# Patient Record
Sex: Female | Born: 1935 | Race: White | Hispanic: No | Smoking: Never smoker
Health system: Southern US, Community
[De-identification: ages and names within clinical notes are randomized; demographics above are authoritative.]

## PROBLEM LIST (undated history)

## (undated) DIAGNOSIS — I341 Nonrheumatic mitral (valve) prolapse: Secondary | ICD-10-CM

## (undated) DIAGNOSIS — R0989 Other specified symptoms and signs involving the circulatory and respiratory systems: Secondary | ICD-10-CM

## (undated) DIAGNOSIS — I10 Essential (primary) hypertension: Secondary | ICD-10-CM

## (undated) DIAGNOSIS — E785 Hyperlipidemia, unspecified: Secondary | ICD-10-CM

## (undated) DIAGNOSIS — R002 Palpitations: Secondary | ICD-10-CM

## (undated) HISTORY — DX: Palpitations: R00.2

## (undated) HISTORY — DX: Other specified symptoms and signs involving the circulatory and respiratory systems: R09.89

## (undated) HISTORY — DX: Nonrheumatic mitral (valve) prolapse: I34.1

## (undated) HISTORY — DX: Hyperlipidemia, unspecified: E78.5

## (undated) HISTORY — DX: Essential (primary) hypertension: I10

---

## 2000-05-03 ENCOUNTER — Encounter: Admission: RE | Admit: 2000-05-03 | Discharge: 2000-05-03 | Payer: Self-pay | Admitting: Internal Medicine

## 2000-05-03 ENCOUNTER — Encounter: Payer: Self-pay | Admitting: Internal Medicine

## 2001-04-01 ENCOUNTER — Other Ambulatory Visit: Admission: RE | Admit: 2001-04-01 | Discharge: 2001-04-01 | Payer: Self-pay | Admitting: Internal Medicine

## 2001-05-01 ENCOUNTER — Encounter: Admission: RE | Admit: 2001-05-01 | Discharge: 2001-05-01 | Payer: Self-pay | Admitting: Internal Medicine

## 2001-05-01 ENCOUNTER — Encounter: Payer: Self-pay | Admitting: Internal Medicine

## 2001-10-21 ENCOUNTER — Ambulatory Visit (HOSPITAL_COMMUNITY): Admission: RE | Admit: 2001-10-21 | Discharge: 2001-10-21 | Payer: Self-pay | Admitting: Internal Medicine

## 2002-05-04 ENCOUNTER — Encounter: Payer: Self-pay | Admitting: Internal Medicine

## 2002-05-04 ENCOUNTER — Encounter: Admission: RE | Admit: 2002-05-04 | Discharge: 2002-05-04 | Payer: Self-pay | Admitting: Internal Medicine

## 2002-06-20 ENCOUNTER — Emergency Department (HOSPITAL_COMMUNITY): Admission: EM | Admit: 2002-06-20 | Discharge: 2002-06-20 | Payer: Self-pay | Admitting: *Deleted

## 2003-06-29 ENCOUNTER — Encounter: Admission: RE | Admit: 2003-06-29 | Discharge: 2003-06-29 | Payer: Self-pay | Admitting: Internal Medicine

## 2004-05-25 ENCOUNTER — Other Ambulatory Visit: Admission: RE | Admit: 2004-05-25 | Discharge: 2004-05-25 | Payer: Self-pay | Admitting: Family Medicine

## 2004-07-17 ENCOUNTER — Encounter: Admission: RE | Admit: 2004-07-17 | Discharge: 2004-07-17 | Payer: Self-pay | Admitting: Internal Medicine

## 2006-08-08 ENCOUNTER — Encounter: Admission: RE | Admit: 2006-08-08 | Discharge: 2006-08-08 | Payer: Self-pay | Admitting: Family Medicine

## 2007-06-09 ENCOUNTER — Other Ambulatory Visit: Admission: RE | Admit: 2007-06-09 | Discharge: 2007-06-09 | Payer: Self-pay | Admitting: Family Medicine

## 2008-08-09 ENCOUNTER — Encounter: Admission: RE | Admit: 2008-08-09 | Discharge: 2008-08-09 | Payer: Self-pay | Admitting: Family Medicine

## 2009-10-27 ENCOUNTER — Encounter: Admission: RE | Admit: 2009-10-27 | Discharge: 2009-10-27 | Payer: Self-pay | Admitting: Family Medicine

## 2010-09-28 ENCOUNTER — Other Ambulatory Visit: Payer: Self-pay | Admitting: Family Medicine

## 2010-09-28 DIAGNOSIS — R102 Pelvic and perineal pain: Secondary | ICD-10-CM

## 2010-10-05 ENCOUNTER — Ambulatory Visit
Admission: RE | Admit: 2010-10-05 | Discharge: 2010-10-05 | Disposition: A | Discharge status: Home / Self Care | Payer: PRIVATE HEALTH INSURANCE | Source: Ambulatory Visit | Attending: Family Medicine | Admitting: Family Medicine

## 2010-10-05 ENCOUNTER — Ambulatory Visit
Admission: RE | Admit: 2010-10-05 | Discharge: 2010-10-05 | Disposition: A | Discharge status: Home / Self Care | Payer: Medicare Other | Source: Ambulatory Visit | Attending: Family Medicine | Admitting: Family Medicine

## 2010-10-05 DIAGNOSIS — R102 Pelvic and perineal pain: Secondary | ICD-10-CM

## 2011-04-05 ENCOUNTER — Other Ambulatory Visit: Payer: Self-pay

## 2011-04-05 ENCOUNTER — Other Ambulatory Visit (HOSPITAL_COMMUNITY)
Admission: RE | Admit: 2011-04-05 | Discharge: 2011-04-05 | Disposition: A | Discharge status: Home / Self Care | Payer: Medicare Other | Source: Ambulatory Visit | Attending: Internal Medicine | Admitting: Internal Medicine

## 2011-04-05 DIAGNOSIS — Z124 Encounter for screening for malignant neoplasm of cervix: Secondary | ICD-10-CM | POA: Insufficient documentation

## 2011-08-27 DIAGNOSIS — H353290 Exudative age-related macular degeneration, unspecified eye, stage unspecified: Secondary | ICD-10-CM | POA: Diagnosis not present

## 2011-09-28 DIAGNOSIS — H43819 Vitreous degeneration, unspecified eye: Secondary | ICD-10-CM | POA: Diagnosis not present

## 2011-09-28 DIAGNOSIS — H353190 Nonexudative age-related macular degeneration, unspecified eye, stage unspecified: Secondary | ICD-10-CM | POA: Diagnosis not present

## 2011-09-28 DIAGNOSIS — H353290 Exudative age-related macular degeneration, unspecified eye, stage unspecified: Secondary | ICD-10-CM | POA: Diagnosis not present

## 2011-09-28 DIAGNOSIS — H35059 Retinal neovascularization, unspecified, unspecified eye: Secondary | ICD-10-CM | POA: Diagnosis not present

## 2011-10-29 DIAGNOSIS — H353190 Nonexudative age-related macular degeneration, unspecified eye, stage unspecified: Secondary | ICD-10-CM | POA: Diagnosis not present

## 2011-10-29 DIAGNOSIS — H35059 Retinal neovascularization, unspecified, unspecified eye: Secondary | ICD-10-CM | POA: Diagnosis not present

## 2011-10-29 DIAGNOSIS — H43819 Vitreous degeneration, unspecified eye: Secondary | ICD-10-CM | POA: Diagnosis not present

## 2011-10-29 DIAGNOSIS — H353290 Exudative age-related macular degeneration, unspecified eye, stage unspecified: Secondary | ICD-10-CM | POA: Diagnosis not present

## 2011-11-08 DIAGNOSIS — E7800 Pure hypercholesterolemia, unspecified: Secondary | ICD-10-CM | POA: Diagnosis not present

## 2011-12-03 DIAGNOSIS — B0239 Other herpes zoster eye disease: Secondary | ICD-10-CM | POA: Diagnosis not present

## 2011-12-03 DIAGNOSIS — H353290 Exudative age-related macular degeneration, unspecified eye, stage unspecified: Secondary | ICD-10-CM | POA: Diagnosis not present

## 2011-12-03 DIAGNOSIS — H35059 Retinal neovascularization, unspecified, unspecified eye: Secondary | ICD-10-CM | POA: Diagnosis not present

## 2011-12-19 DIAGNOSIS — T148 Other injury of unspecified body region: Secondary | ICD-10-CM | POA: Diagnosis not present

## 2011-12-19 DIAGNOSIS — W57XXXA Bitten or stung by nonvenomous insect and other nonvenomous arthropods, initial encounter: Secondary | ICD-10-CM | POA: Diagnosis not present

## 2011-12-19 DIAGNOSIS — B029 Zoster without complications: Secondary | ICD-10-CM | POA: Diagnosis not present

## 2011-12-19 DIAGNOSIS — K219 Gastro-esophageal reflux disease without esophagitis: Secondary | ICD-10-CM | POA: Diagnosis not present

## 2011-12-24 DIAGNOSIS — H353290 Exudative age-related macular degeneration, unspecified eye, stage unspecified: Secondary | ICD-10-CM | POA: Diagnosis not present

## 2011-12-24 DIAGNOSIS — H35059 Retinal neovascularization, unspecified, unspecified eye: Secondary | ICD-10-CM | POA: Diagnosis not present

## 2011-12-24 DIAGNOSIS — H43819 Vitreous degeneration, unspecified eye: Secondary | ICD-10-CM | POA: Diagnosis not present

## 2011-12-24 DIAGNOSIS — H353190 Nonexudative age-related macular degeneration, unspecified eye, stage unspecified: Secondary | ICD-10-CM | POA: Diagnosis not present

## 2011-12-27 DIAGNOSIS — Z79899 Other long term (current) drug therapy: Secondary | ICD-10-CM | POA: Diagnosis not present

## 2011-12-27 DIAGNOSIS — E7800 Pure hypercholesterolemia, unspecified: Secondary | ICD-10-CM | POA: Diagnosis not present

## 2011-12-27 DIAGNOSIS — M810 Age-related osteoporosis without current pathological fracture: Secondary | ICD-10-CM | POA: Diagnosis not present

## 2011-12-27 DIAGNOSIS — R319 Hematuria, unspecified: Secondary | ICD-10-CM | POA: Diagnosis not present

## 2011-12-27 DIAGNOSIS — R209 Unspecified disturbances of skin sensation: Secondary | ICD-10-CM | POA: Diagnosis not present

## 2012-01-21 DIAGNOSIS — H353190 Nonexudative age-related macular degeneration, unspecified eye, stage unspecified: Secondary | ICD-10-CM | POA: Diagnosis not present

## 2012-01-21 DIAGNOSIS — H353290 Exudative age-related macular degeneration, unspecified eye, stage unspecified: Secondary | ICD-10-CM | POA: Diagnosis not present

## 2012-01-21 DIAGNOSIS — H35059 Retinal neovascularization, unspecified, unspecified eye: Secondary | ICD-10-CM | POA: Diagnosis not present

## 2012-01-21 DIAGNOSIS — B0232 Zoster iridocyclitis: Secondary | ICD-10-CM | POA: Diagnosis not present

## 2012-01-28 DIAGNOSIS — H35059 Retinal neovascularization, unspecified, unspecified eye: Secondary | ICD-10-CM | POA: Diagnosis not present

## 2012-01-28 DIAGNOSIS — H43819 Vitreous degeneration, unspecified eye: Secondary | ICD-10-CM | POA: Diagnosis not present

## 2012-01-28 DIAGNOSIS — H353190 Nonexudative age-related macular degeneration, unspecified eye, stage unspecified: Secondary | ICD-10-CM | POA: Diagnosis not present

## 2012-01-28 DIAGNOSIS — H353290 Exudative age-related macular degeneration, unspecified eye, stage unspecified: Secondary | ICD-10-CM | POA: Diagnosis not present

## 2012-02-12 DIAGNOSIS — B0232 Zoster iridocyclitis: Secondary | ICD-10-CM | POA: Diagnosis not present

## 2012-03-04 DIAGNOSIS — H353290 Exudative age-related macular degeneration, unspecified eye, stage unspecified: Secondary | ICD-10-CM | POA: Diagnosis not present

## 2012-03-17 DIAGNOSIS — H353290 Exudative age-related macular degeneration, unspecified eye, stage unspecified: Secondary | ICD-10-CM | POA: Diagnosis not present

## 2012-03-17 DIAGNOSIS — H43819 Vitreous degeneration, unspecified eye: Secondary | ICD-10-CM | POA: Diagnosis not present

## 2012-03-17 DIAGNOSIS — H353190 Nonexudative age-related macular degeneration, unspecified eye, stage unspecified: Secondary | ICD-10-CM | POA: Diagnosis not present

## 2012-03-17 DIAGNOSIS — H35059 Retinal neovascularization, unspecified, unspecified eye: Secondary | ICD-10-CM | POA: Diagnosis not present

## 2012-04-10 DIAGNOSIS — Z23 Encounter for immunization: Secondary | ICD-10-CM | POA: Diagnosis not present

## 2012-04-10 DIAGNOSIS — B0229 Other postherpetic nervous system involvement: Secondary | ICD-10-CM | POA: Diagnosis not present

## 2012-04-10 DIAGNOSIS — B029 Zoster without complications: Secondary | ICD-10-CM | POA: Diagnosis not present

## 2012-05-13 DIAGNOSIS — H35059 Retinal neovascularization, unspecified, unspecified eye: Secondary | ICD-10-CM | POA: Diagnosis not present

## 2012-05-13 DIAGNOSIS — H353290 Exudative age-related macular degeneration, unspecified eye, stage unspecified: Secondary | ICD-10-CM | POA: Diagnosis not present

## 2012-06-10 DIAGNOSIS — B029 Zoster without complications: Secondary | ICD-10-CM | POA: Diagnosis not present

## 2012-06-10 DIAGNOSIS — E785 Hyperlipidemia, unspecified: Secondary | ICD-10-CM | POA: Diagnosis not present

## 2012-06-10 DIAGNOSIS — E559 Vitamin D deficiency, unspecified: Secondary | ICD-10-CM | POA: Diagnosis not present

## 2012-06-10 DIAGNOSIS — Z79899 Other long term (current) drug therapy: Secondary | ICD-10-CM | POA: Diagnosis not present

## 2012-07-26 IMAGING — US US PELVIS COMPLETE
1 series · 14 of 25 positions shown · non-contrast
Comparison: None

CLINICAL DATA: Pelvic pain.



[Series 1: us pelvis complete · 0.24mm/px · 14 of 73 slices shown]
[im 1/73]
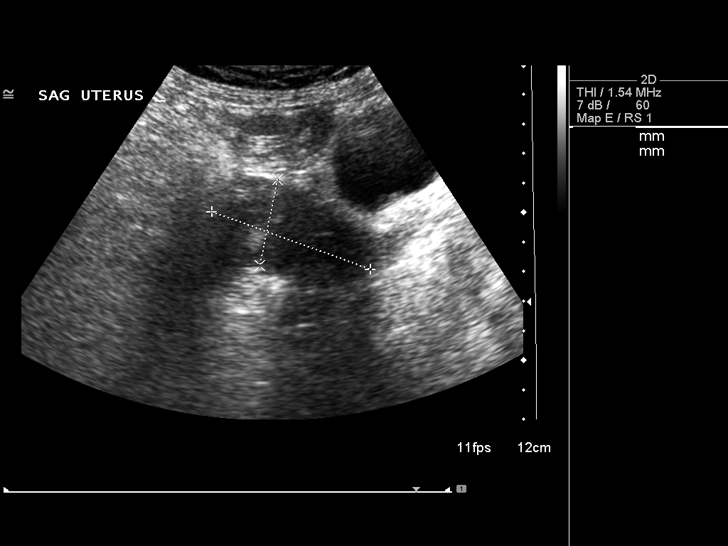
[im 7/73]
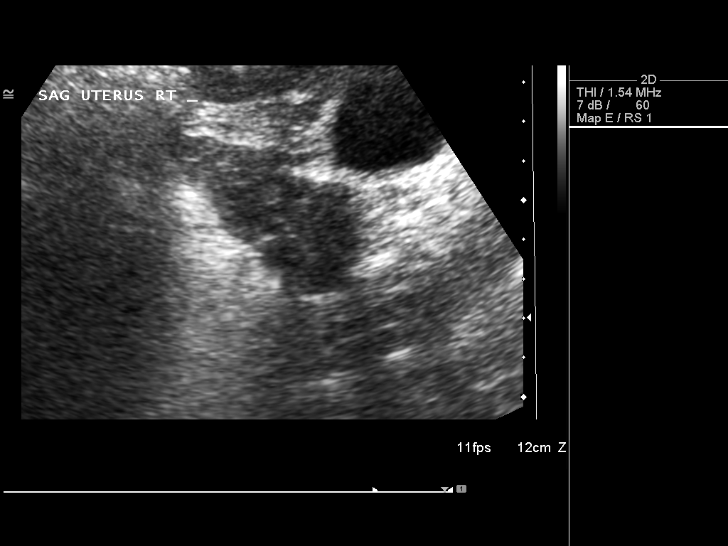
[im 13/73]
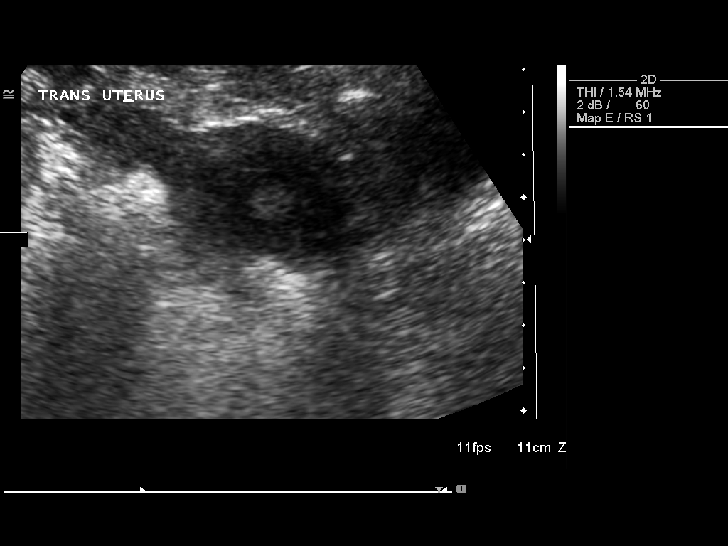
[im 19/73]
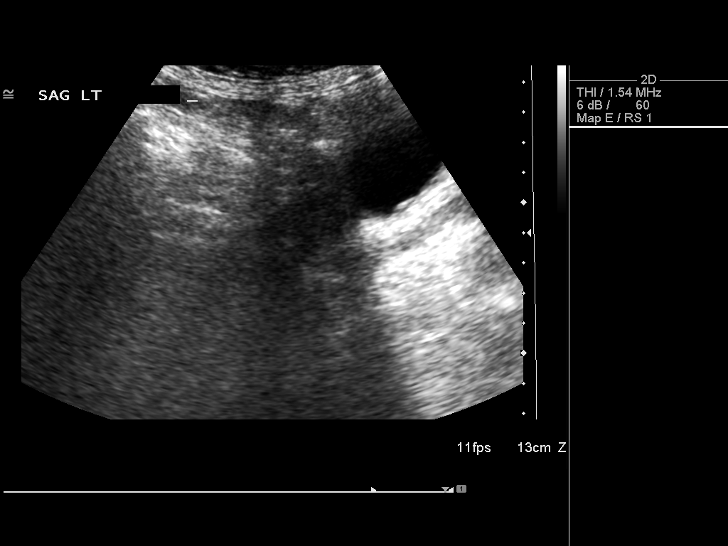
[im 25/73]
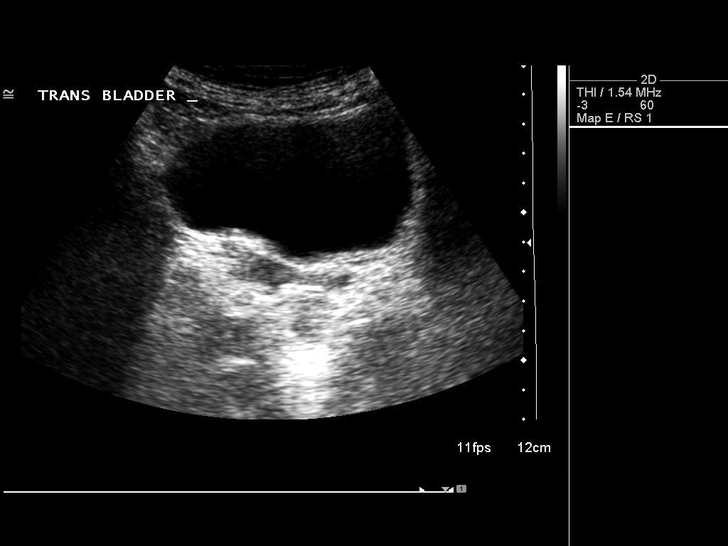
[im 28/73]
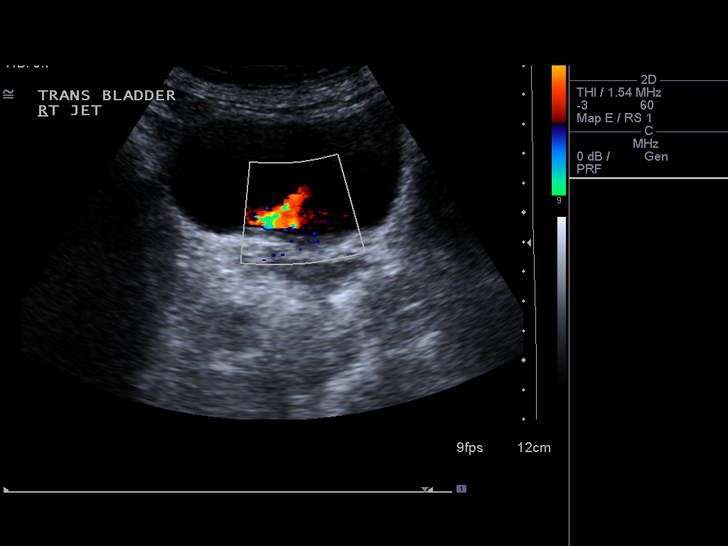
[im 34/73]
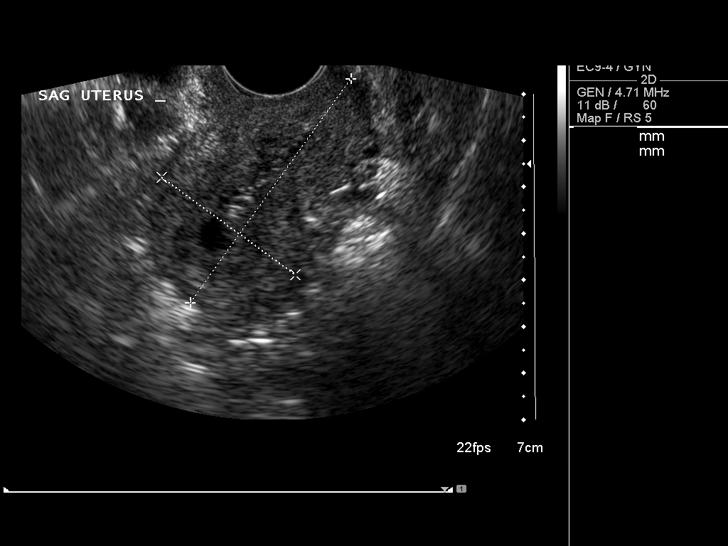
[im 40/73]
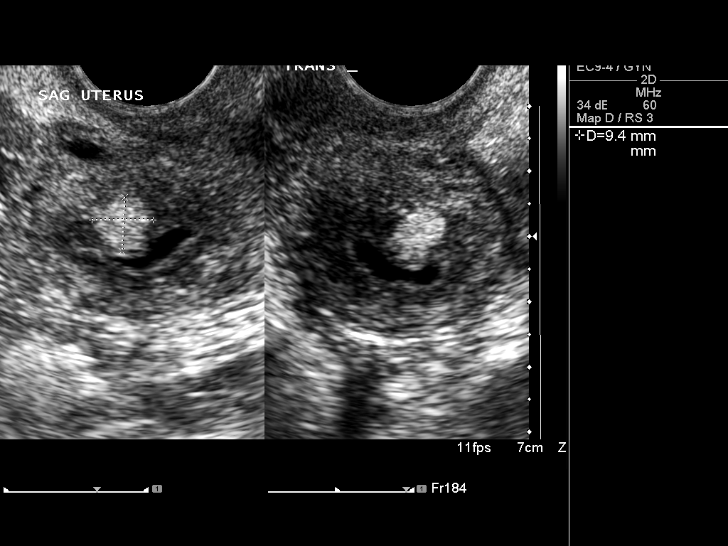
[im 46/73]
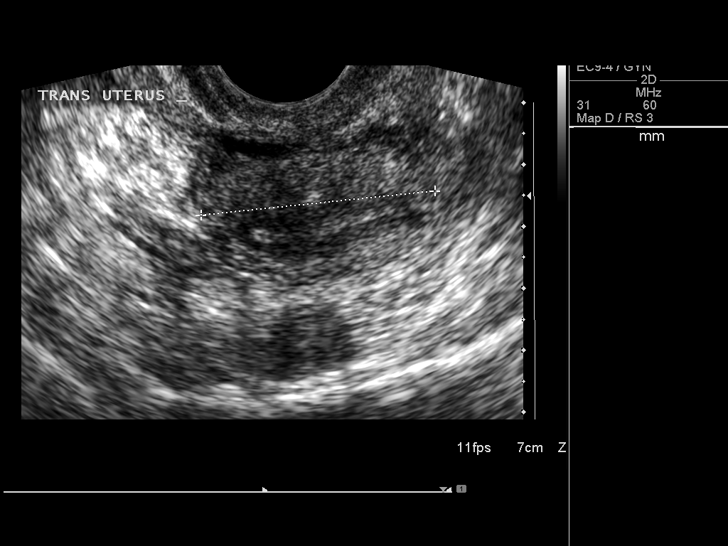
[im 49/73]
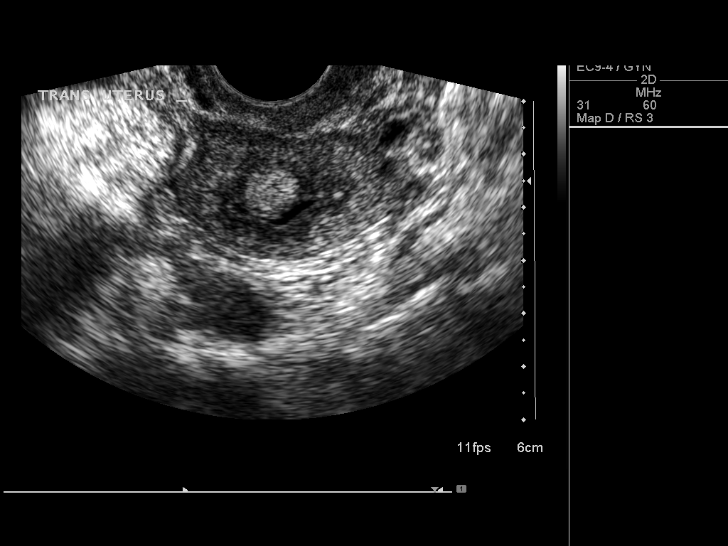
[im 55/73]
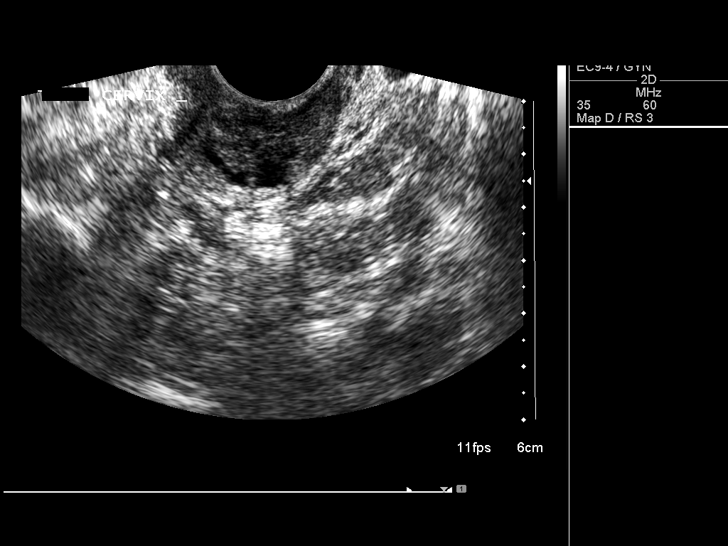
[im 61/73]
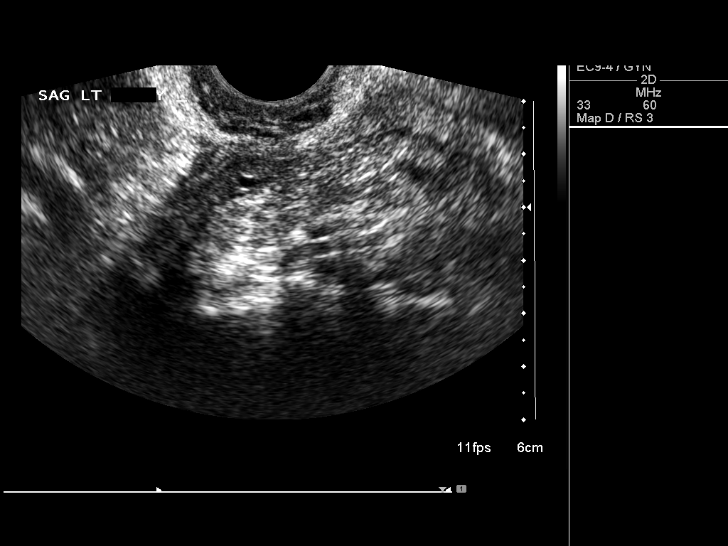
[im 67/73]
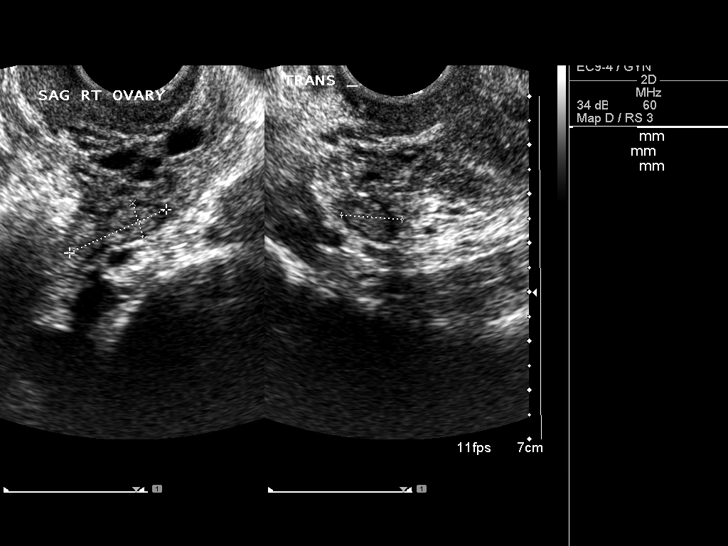
[im 73/73]
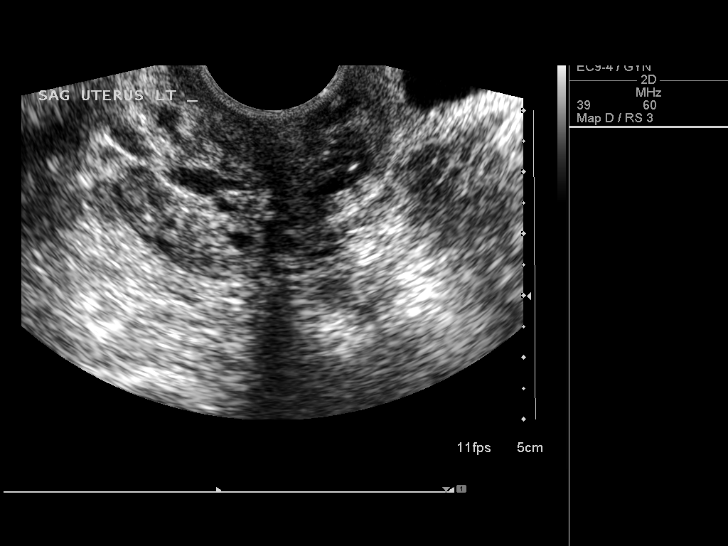

[14 of 25 positions shown; findings below may reference images not displayed]

FINDINGS: Uterus measures 5.9 x 3.6 x 3.8 cm.  The uterus is flexed to the
right side.  A rounded hyperechoic lesion is noted in the fundal
region anteriorly measuring 9 x 8 x 11 mm.  This is most likely a
submucosal fibroid.

Endometrium normal and thickness measuring 4 mm.  A small amount of
fluid in the endometrial canal is noted.

Right Ovary measures 2.2 x 0.8 x 1.2 cm.  No cysts or masses.

Left Ovary measures 2.9 x 1.4 x 2.9 cm.  No cysts or masses.

Other Findings:  No free pelvic fluid collections.
IMPRESSION: 1.  11 mm hyperechoic lesion bulging slightly into the endometrium,
most likely a submucosal fibroid.
2.  Normal ovaries.
3.  Small amount of endometrial fluid.

## 2012-08-19 DIAGNOSIS — H353290 Exudative age-related macular degeneration, unspecified eye, stage unspecified: Secondary | ICD-10-CM | POA: Diagnosis not present

## 2012-12-16 DIAGNOSIS — H353190 Nonexudative age-related macular degeneration, unspecified eye, stage unspecified: Secondary | ICD-10-CM | POA: Diagnosis not present

## 2012-12-16 DIAGNOSIS — H353290 Exudative age-related macular degeneration, unspecified eye, stage unspecified: Secondary | ICD-10-CM | POA: Diagnosis not present

## 2012-12-16 DIAGNOSIS — H35059 Retinal neovascularization, unspecified, unspecified eye: Secondary | ICD-10-CM | POA: Diagnosis not present

## 2012-12-25 DIAGNOSIS — R350 Frequency of micturition: Secondary | ICD-10-CM | POA: Diagnosis not present

## 2012-12-25 DIAGNOSIS — Z79899 Other long term (current) drug therapy: Secondary | ICD-10-CM | POA: Diagnosis not present

## 2012-12-25 DIAGNOSIS — J309 Allergic rhinitis, unspecified: Secondary | ICD-10-CM | POA: Diagnosis not present

## 2012-12-25 DIAGNOSIS — K219 Gastro-esophageal reflux disease without esophagitis: Secondary | ICD-10-CM | POA: Diagnosis not present

## 2012-12-25 DIAGNOSIS — M810 Age-related osteoporosis without current pathological fracture: Secondary | ICD-10-CM | POA: Diagnosis not present

## 2012-12-25 DIAGNOSIS — E785 Hyperlipidemia, unspecified: Secondary | ICD-10-CM | POA: Diagnosis not present

## 2013-05-12 DIAGNOSIS — H353190 Nonexudative age-related macular degeneration, unspecified eye, stage unspecified: Secondary | ICD-10-CM | POA: Diagnosis not present

## 2013-05-12 DIAGNOSIS — H35379 Puckering of macula, unspecified eye: Secondary | ICD-10-CM | POA: Diagnosis not present

## 2013-05-12 DIAGNOSIS — H353290 Exudative age-related macular degeneration, unspecified eye, stage unspecified: Secondary | ICD-10-CM | POA: Diagnosis not present

## 2013-07-08 DIAGNOSIS — M25519 Pain in unspecified shoulder: Secondary | ICD-10-CM | POA: Diagnosis not present

## 2013-07-08 DIAGNOSIS — K219 Gastro-esophageal reflux disease without esophagitis: Secondary | ICD-10-CM | POA: Diagnosis not present

## 2013-07-08 DIAGNOSIS — Z79899 Other long term (current) drug therapy: Secondary | ICD-10-CM | POA: Diagnosis not present

## 2013-07-08 DIAGNOSIS — R350 Frequency of micturition: Secondary | ICD-10-CM | POA: Diagnosis not present

## 2013-07-08 DIAGNOSIS — E785 Hyperlipidemia, unspecified: Secondary | ICD-10-CM | POA: Diagnosis not present

## 2013-07-08 DIAGNOSIS — R5383 Other fatigue: Secondary | ICD-10-CM | POA: Diagnosis not present

## 2013-07-08 DIAGNOSIS — T07XXXA Unspecified multiple injuries, initial encounter: Secondary | ICD-10-CM | POA: Diagnosis not present

## 2013-07-08 DIAGNOSIS — R5381 Other malaise: Secondary | ICD-10-CM | POA: Diagnosis not present

## 2013-10-13 DIAGNOSIS — H35379 Puckering of macula, unspecified eye: Secondary | ICD-10-CM | POA: Diagnosis not present

## 2013-10-13 DIAGNOSIS — H2510 Age-related nuclear cataract, unspecified eye: Secondary | ICD-10-CM | POA: Diagnosis not present

## 2013-10-13 DIAGNOSIS — H353290 Exudative age-related macular degeneration, unspecified eye, stage unspecified: Secondary | ICD-10-CM | POA: Diagnosis not present

## 2014-04-20 DIAGNOSIS — H353190 Nonexudative age-related macular degeneration, unspecified eye, stage unspecified: Secondary | ICD-10-CM | POA: Diagnosis not present

## 2014-04-20 DIAGNOSIS — H353290 Exudative age-related macular degeneration, unspecified eye, stage unspecified: Secondary | ICD-10-CM | POA: Diagnosis not present

## 2014-06-16 DIAGNOSIS — E780 Pure hypercholesterolemia: Secondary | ICD-10-CM | POA: Diagnosis not present

## 2014-06-16 DIAGNOSIS — M810 Age-related osteoporosis without current pathological fracture: Secondary | ICD-10-CM | POA: Diagnosis not present

## 2014-06-16 DIAGNOSIS — Z79899 Other long term (current) drug therapy: Secondary | ICD-10-CM | POA: Diagnosis not present

## 2014-06-24 ENCOUNTER — Other Ambulatory Visit: Payer: Self-pay | Admitting: Family Medicine

## 2014-06-24 DIAGNOSIS — R1030 Lower abdominal pain, unspecified: Secondary | ICD-10-CM | POA: Diagnosis not present

## 2014-06-24 DIAGNOSIS — L989 Disorder of the skin and subcutaneous tissue, unspecified: Secondary | ICD-10-CM | POA: Diagnosis not present

## 2014-06-24 DIAGNOSIS — M810 Age-related osteoporosis without current pathological fracture: Secondary | ICD-10-CM | POA: Diagnosis not present

## 2014-06-24 DIAGNOSIS — H9190 Unspecified hearing loss, unspecified ear: Secondary | ICD-10-CM | POA: Diagnosis not present

## 2014-06-24 DIAGNOSIS — K219 Gastro-esophageal reflux disease without esophagitis: Secondary | ICD-10-CM | POA: Diagnosis not present

## 2014-06-24 DIAGNOSIS — R42 Dizziness and giddiness: Secondary | ICD-10-CM | POA: Diagnosis not present

## 2014-06-24 DIAGNOSIS — E780 Pure hypercholesterolemia: Secondary | ICD-10-CM | POA: Diagnosis not present

## 2014-06-24 DIAGNOSIS — E569 Vitamin deficiency, unspecified: Secondary | ICD-10-CM | POA: Diagnosis not present

## 2014-07-23 DIAGNOSIS — W57XXXA Bitten or stung by nonvenomous insect and other nonvenomous arthropods, initial encounter: Secondary | ICD-10-CM | POA: Diagnosis not present

## 2014-07-23 DIAGNOSIS — S30861A Insect bite (nonvenomous) of abdominal wall, initial encounter: Secondary | ICD-10-CM | POA: Diagnosis not present

## 2014-08-09 DIAGNOSIS — R05 Cough: Secondary | ICD-10-CM | POA: Diagnosis not present

## 2014-08-09 DIAGNOSIS — H6123 Impacted cerumen, bilateral: Secondary | ICD-10-CM | POA: Diagnosis not present

## 2014-08-09 DIAGNOSIS — H9190 Unspecified hearing loss, unspecified ear: Secondary | ICD-10-CM | POA: Diagnosis not present

## 2014-08-09 DIAGNOSIS — J310 Chronic rhinitis: Secondary | ICD-10-CM | POA: Diagnosis not present

## 2014-08-09 DIAGNOSIS — K219 Gastro-esophageal reflux disease without esophagitis: Secondary | ICD-10-CM | POA: Diagnosis not present

## 2014-08-16 ENCOUNTER — Inpatient Hospital Stay: Admission: RE | Admit: 2014-08-16 | Payer: PRIVATE HEALTH INSURANCE | Source: Ambulatory Visit

## 2014-08-19 DIAGNOSIS — L821 Other seborrheic keratosis: Secondary | ICD-10-CM | POA: Diagnosis not present

## 2014-08-19 DIAGNOSIS — L570 Actinic keratosis: Secondary | ICD-10-CM | POA: Diagnosis not present

## 2014-09-01 DIAGNOSIS — R351 Nocturia: Secondary | ICD-10-CM | POA: Diagnosis not present

## 2014-10-19 DIAGNOSIS — H3531 Nonexudative age-related macular degeneration: Secondary | ICD-10-CM | POA: Diagnosis not present

## 2014-10-19 DIAGNOSIS — H35371 Puckering of macula, right eye: Secondary | ICD-10-CM | POA: Diagnosis not present

## 2014-12-28 ENCOUNTER — Emergency Department (INDEPENDENT_AMBULATORY_CARE_PROVIDER_SITE_OTHER)
Admission: EM | Admit: 2014-12-28 | Discharge: 2014-12-28 | Disposition: A | Discharge status: Home / Self Care | Payer: Medicare Other | Source: Home / Self Care | Attending: Family Medicine | Admitting: Family Medicine

## 2014-12-28 ENCOUNTER — Encounter (HOSPITAL_COMMUNITY): Payer: Self-pay | Admitting: Emergency Medicine

## 2014-12-28 DIAGNOSIS — H8110 Benign paroxysmal vertigo, unspecified ear: Secondary | ICD-10-CM

## 2014-12-28 DIAGNOSIS — R42 Dizziness and giddiness: Secondary | ICD-10-CM | POA: Diagnosis not present

## 2014-12-28 MED ORDER — MECLIZINE HCL 25 MG PO TABS: ORAL_TABLET | ORAL | Status: DC

## 2014-12-28 NOTE — Discharge Instructions (Signed)
Benign Positional Vertigo Meclizine 12.5 mg every 6 to 8 hours as needed for dizziness. This can cause some drowsiness, be careful. Slow movements, no bending or rapid movements. See your doctor tomorrow.  Vertigo means you feel like you or your surroundings are moving when they are not. Benign positional vertigo is the most common form of vertigo. Benign means that the cause of your condition is not serious. Benign positional vertigo is more common in older adults. CAUSES  Benign positional vertigo is the result of an upset in the labyrinth system. This is an area in the middle ear that helps control your balance. This may be caused by a viral infection, head injury, or repetitive motion. However, often no specific cause is found. SYMPTOMS  Symptoms of benign positional vertigo occur when you move your head or eyes in different directions. Some of the symptoms may include:  Loss of balance and falls.  Vomiting.  Blurred vision.  Dizziness.  Nausea.  Involuntary eye movements (nystagmus). DIAGNOSIS  Benign positional vertigo is usually diagnosed by physical exam. If the specific cause of your benign positional vertigo is unknown, your caregiver may perform imaging tests, such as magnetic resonance imaging (MRI) or computed tomography (CT). TREATMENT  Your caregiver may recommend movements or procedures to correct the benign positional vertigo. Medicines such as meclizine, benzodiazepines, and medicines for nausea may be used to treat your symptoms. In rare cases, if your symptoms are caused by certain conditions that affect the inner ear, you may need surgery. HOME CARE INSTRUCTIONS   Follow your caregiver's instructions.  Move slowly. Do not make sudden body or head movements.  Avoid driving.  Avoid operating heavy machinery.  Avoid performing any tasks that would be dangerous to you or others during a vertigo episode.  Drink enough fluids to keep your urine clear or pale  yellow. SEEK IMMEDIATE MEDICAL CARE IF:   You develop problems with walking, weakness, numbness, or using your arms, hands, or legs.  You have difficulty speaking.  You develop severe headaches.  Your nausea or vomiting continues or gets worse.  You develop visual changes.  Your family or friends notice any behavioral changes.  Your condition gets worse.  You have a fever.  You develop a stiff neck or sensitivity to light. MAKE SURE YOU:   Understand these instructions.  Will watch your condition.  Will get help right away if you are not doing well or get worse. Document Released: 04/30/2006 Document Revised: 10/15/2011 Document Reviewed: 04/12/2011 [facility] Patient Information 2015 [employer], [place]. This information is not intended to replace advice given to you by your health care provider. Make sure you discuss any questions you have with your health care provider.  Dizziness Dizziness is a common problem. It is a feeling of unsteadiness or light-headedness. You may feel like you are about to faint. Dizziness can lead to injury if you stumble or fall. A person of any age group can suffer from dizziness, but dizziness is more common in older adults. CAUSES  Dizziness can be caused by many different things, including:  Middle ear problems.  Standing for too long.  Infections.  An allergic reaction.  Aging.  An emotional response to something, such as the sight of blood.  Side effects of medicines.  Tiredness.  Problems with circulation or blood pressure.  Excessive use of alcohol or medicines, or illegal drug use.  Breathing too fast (hyperventilation).  An irregular heart rhythm (arrhythmia).  A low red blood cell count (anemia).  Pregnancy.  Vomiting, diarrhea, fever, or other illnesses that cause body fluid loss (dehydration).  Diseases or conditions such as Parkinson's disease, high blood pressure (hypertension), diabetes, and thyroid  problems.  Exposure to extreme heat. DIAGNOSIS  Your health care provider will ask about your symptoms, perform a physical exam, and perform an electrocardiogram (ECG) to record the electrical activity of your heart. Your health care provider may also perform other heart or blood tests to determine the cause of your dizziness. These may include:  Transthoracic echocardiogram (TTE). During echocardiography, sound waves are used to evaluate how blood flows through your heart.  Transesophageal echocardiogram (TEE).  Cardiac monitoring. This allows your health care provider to monitor your heart rate and rhythm in real time.  Holter monitor. This is a portable device that records your heartbeat and can help diagnose heart arrhythmias. It allows your health care provider to track your heart activity for several days if needed.  Stress tests by exercise or by giving medicine that makes the heart beat faster. TREATMENT  Treatment of dizziness depends on the cause of your symptoms and can vary greatly. HOME CARE INSTRUCTIONS   Drink enough fluids to keep your urine clear or pale yellow. This is especially important in very hot weather. In older adults, it is also important in cold weather.  Take your medicine exactly as directed if your dizziness is caused by medicines. When taking blood pressure medicines, it is especially important to get up slowly.  Rise slowly from chairs and steady yourself until you feel okay.  In the morning, first sit up on the side of the bed. When you feel okay, stand slowly while holding onto something until you know your balance is fine.  Move your legs often if you need to stand in one place for a long time. Tighten and relax your muscles in your legs while standing.  Have someone stay with you for 1-2 days if dizziness continues to be a problem. Do this until you feel you are well enough to stay alone. Have the person call your health care provider if he or she  notices changes in you that are concerning.  Do not drive or use heavy machinery if you feel dizzy.  Do not drink alcohol. SEEK IMMEDIATE MEDICAL CARE IF:   Your dizziness or light-headedness gets worse.  You feel nauseous or vomit.  You have problems talking, walking, or using your arms, hands, or legs.  You feel weak.  You are not thinking clearly or you have trouble forming sentences. It may take a friend or family member to notice this.  You have chest pain, abdominal pain, shortness of breath, or sweating.  Your vision changes.  You notice any bleeding.  You have side effects from medicine that seems to be getting worse rather than better. MAKE SURE YOU:   Understand these instructions.  Will watch your condition.  Will get help right away if you are not doing well or get worse. Document Released: 01/16/2001 Document Revised: 07/28/2013 Document Reviewed: 02/09/2011 [facility] Patient Information 2015 [place], [place]. This information is not intended to replace advice given to you by your health care provider. Make sure you discuss any questions you have with your health care provider.

## 2014-12-28 NOTE — ED Provider Notes (Signed)
CSN: [id number]     Arrival date & time 12/28/14  1152 History   First MD Initiated Contact with Patient 12/28/14 [phone]     Chief Complaint  Patient presents with  . Dizziness   (Consider location/radiation/quality/duration/timing/severity/associated sxs/prior Treatment) HPI Comments: 81 year old female presents with a complaint of dizziness and vertigo. Her first episode of vertigo was in February 2016. She states that while in bed she noticed that the room was spinning. She became nauseated and vomited. After a brief period of symptoms they finally abated. This morning upon awakening at 6:30 AM as she moved her head she developed an acute episode of vertigo. She sat still for a few minutes and then when bending down the dizziness and vertigo that worse and she began to lose her balance. She did not fall or have any injurious event. When trying to ambulate and perform ADLs this morning she continued to have episodes of balance problems and dizziness. She was brought to the urgent care by her granddaughter. Patient denies problems with vision, speech, hearing, swallowing, focal paresthesias or weakness. Denies headache. She denies heaviness, tightness, fullness, pressure or other discomfort within the chest. No shortness of breath. No extremity pain. Denies problems with concentration, confusion, disorientation or memory. Her speech is clear and has good recall.    History reviewed. No pertinent past medical history. History reviewed. No pertinent past surgical history. History reviewed. No pertinent family history. History  Substance Use Topics  . Smoking status: Never Smoker   . Smokeless tobacco: Not on file  . Alcohol Use: No   OB History    No data available     Review of Systems  Constitutional: Positive for activity change and appetite change. Negative for fever, chills and fatigue.  HENT: Positive for hearing loss. Negative for congestion, dental problem, drooling, ear discharge,  ear pain, facial swelling, postnasal drip, rhinorrhea, sinus pressure, sneezing, sore throat and tinnitus.   Eyes: Negative for pain, discharge and visual disturbance.  Respiratory: Negative for cough, chest tightness, shortness of breath and wheezing.   Cardiovascular: Negative for chest pain, palpitations and leg swelling.  Gastrointestinal: Negative.  Negative for nausea, vomiting and abdominal pain.  Genitourinary: Negative.   Musculoskeletal: Negative.   Skin: Negative.   Neurological: Positive for dizziness and light-headedness. Negative for tremors, seizures, syncope, facial asymmetry, speech difficulty, weakness, numbness and headaches.  Hematological: Negative for adenopathy.  Psychiatric/Behavioral: Negative for hallucinations, behavioral problems, confusion, sleep disturbance, self-injury, dysphoric mood, decreased concentration and agitation. The patient is not hyperactive.     Allergies  Review of patient's allergies indicates no known allergies.  Home Medications   Prior to Admission medications   Medication Sig Start Date End Date Taking? Authorizing Provider  meclizine (ANTIVERT) 25 MG tablet Take 1/2 tablet every 6 to 8 hours prn dizziness. 12/28/14   Hayden Rasmussenavid Mabe, NP   BP 181/86 mmHg  Pulse 82  Temp(Src) 97.1 F (36.2 C) (Oral)  Resp 16  SpO2 95% Physical Exam  Constitutional: She is oriented to person, place, and time. She appears well-developed and well-nourished. No distress.  HENT:  Bilateral TMs are normal. No signs of effusion, erythema or retraction. Oropharynx is clear. No erythema, exudates or swelling. Swallowing reflex is normal. No intraoral swelling. Soft palate rises symmetrically. Uvula midline. Tongue midline.  Eyes: Conjunctivae and EOM are normal. Pupils are equal, round, and reactive to light.  Neck: Normal range of motion. Neck supple.  Cardiovascular: Normal rate, regular rhythm, normal heart sounds and intact  distal pulses.   No murmur  heard. Pulmonary/Chest: Effort normal and breath sounds normal. No respiratory distress. She has no wheezes. She has no rales.  Abdominal: Soft. Bowel sounds are normal.  Musculoskeletal: Normal range of motion. She exhibits no edema or tenderness.  Neurological: She is alert and oriented to person, place, and time. She has normal strength and normal reflexes. She displays no tremor. No cranial nerve deficit or sensory deficit. She exhibits normal muscle tone. She displays a negative Romberg sign. Coordination and gait normal.  Heel to toe a little shaky but able to perform the test.  Nursing note and vitals reviewed.   ED Course  Procedures (including critical care time) Labs Review Labs Reviewed - No data to display  Imaging Review No results found.   MDM   1. BPV (benign positional vertigo), unspecified laterality   2. Dizziness    Meclizine 12.5 mg every 6 to 8 hours as needed for dizziness. This can cause some drowsiness, be careful. Slow movements, no bending or rapid movements. See your doctor tomorrow.     Hayden Rasmussen, NP 12/28/14 [phone]

## 2014-12-28 NOTE — ED Notes (Signed)
Reports having an episode of dizziness this morning shortly after waking and while on the toilet.   Denies fever, n/v/d.  No chest pain or sob.   Pt tried taking two baby aspirin with no relief.

## 2014-12-29 DIAGNOSIS — E785 Hyperlipidemia, unspecified: Secondary | ICD-10-CM | POA: Diagnosis not present

## 2014-12-29 DIAGNOSIS — R42 Dizziness and giddiness: Secondary | ICD-10-CM | POA: Diagnosis not present

## 2015-02-04 DIAGNOSIS — R319 Hematuria, unspecified: Secondary | ICD-10-CM | POA: Diagnosis not present

## 2015-02-04 DIAGNOSIS — E785 Hyperlipidemia, unspecified: Secondary | ICD-10-CM | POA: Diagnosis not present

## 2015-02-04 DIAGNOSIS — Z79899 Other long term (current) drug therapy: Secondary | ICD-10-CM | POA: Diagnosis not present

## 2015-02-04 DIAGNOSIS — E559 Vitamin D deficiency, unspecified: Secondary | ICD-10-CM | POA: Diagnosis not present

## 2015-02-04 DIAGNOSIS — M810 Age-related osteoporosis without current pathological fracture: Secondary | ICD-10-CM | POA: Diagnosis not present

## 2015-04-26 DIAGNOSIS — H43813 Vitreous degeneration, bilateral: Secondary | ICD-10-CM | POA: Diagnosis not present

## 2015-04-26 DIAGNOSIS — H3531 Nonexudative age-related macular degeneration: Secondary | ICD-10-CM | POA: Diagnosis not present

## 2015-04-26 DIAGNOSIS — H35371 Puckering of macula, right eye: Secondary | ICD-10-CM | POA: Diagnosis not present

## 2015-11-01 DIAGNOSIS — H353112 Nonexudative age-related macular degeneration, right eye, intermediate dry stage: Secondary | ICD-10-CM | POA: Diagnosis not present

## 2015-11-01 DIAGNOSIS — H43813 Vitreous degeneration, bilateral: Secondary | ICD-10-CM | POA: Diagnosis not present

## 2015-11-01 DIAGNOSIS — H353222 Exudative age-related macular degeneration, left eye, with inactive choroidal neovascularization: Secondary | ICD-10-CM | POA: Diagnosis not present

## 2015-11-01 DIAGNOSIS — H35371 Puckering of macula, right eye: Secondary | ICD-10-CM | POA: Diagnosis not present

## 2015-12-21 DIAGNOSIS — E785 Hyperlipidemia, unspecified: Secondary | ICD-10-CM | POA: Diagnosis not present

## 2015-12-21 DIAGNOSIS — Z79899 Other long term (current) drug therapy: Secondary | ICD-10-CM | POA: Diagnosis not present

## 2015-12-21 DIAGNOSIS — E559 Vitamin D deficiency, unspecified: Secondary | ICD-10-CM | POA: Diagnosis not present

## 2015-12-27 DIAGNOSIS — Z87442 Personal history of urinary calculi: Secondary | ICD-10-CM | POA: Diagnosis not present

## 2015-12-27 DIAGNOSIS — K30 Functional dyspepsia: Secondary | ICD-10-CM | POA: Diagnosis not present

## 2015-12-27 DIAGNOSIS — R319 Hematuria, unspecified: Secondary | ICD-10-CM | POA: Diagnosis not present

## 2015-12-27 DIAGNOSIS — M810 Age-related osteoporosis without current pathological fracture: Secondary | ICD-10-CM | POA: Diagnosis not present

## 2015-12-27 DIAGNOSIS — Z1239 Encounter for other screening for malignant neoplasm of breast: Secondary | ICD-10-CM | POA: Diagnosis not present

## 2015-12-27 DIAGNOSIS — E785 Hyperlipidemia, unspecified: Secondary | ICD-10-CM | POA: Diagnosis not present

## 2015-12-27 DIAGNOSIS — R8290 Unspecified abnormal findings in urine: Secondary | ICD-10-CM | POA: Diagnosis not present

## 2015-12-27 DIAGNOSIS — E559 Vitamin D deficiency, unspecified: Secondary | ICD-10-CM | POA: Diagnosis not present

## 2015-12-27 DIAGNOSIS — R350 Frequency of micturition: Secondary | ICD-10-CM | POA: Diagnosis not present

## 2015-12-30 ENCOUNTER — Other Ambulatory Visit: Payer: Self-pay | Admitting: Family Medicine

## 2015-12-30 DIAGNOSIS — M810 Age-related osteoporosis without current pathological fracture: Secondary | ICD-10-CM

## 2016-01-13 ENCOUNTER — Ambulatory Visit
Admission: RE | Admit: 2016-01-13 | Discharge: 2016-01-13 | Disposition: A | Discharge status: Home / Self Care | Payer: Medicare Other | Source: Ambulatory Visit | Attending: Family Medicine | Admitting: Family Medicine

## 2016-01-13 DIAGNOSIS — M810 Age-related osteoporosis without current pathological fracture: Secondary | ICD-10-CM | POA: Diagnosis not present

## 2016-01-13 DIAGNOSIS — Z780 Asymptomatic menopausal state: Secondary | ICD-10-CM | POA: Diagnosis not present

## 2016-02-02 DIAGNOSIS — H353132 Nonexudative age-related macular degeneration, bilateral, intermediate dry stage: Secondary | ICD-10-CM | POA: Diagnosis not present

## 2016-02-02 DIAGNOSIS — H2513 Age-related nuclear cataract, bilateral: Secondary | ICD-10-CM | POA: Diagnosis not present

## 2016-04-24 DIAGNOSIS — H353211 Exudative age-related macular degeneration, right eye, with active choroidal neovascularization: Secondary | ICD-10-CM | POA: Diagnosis not present

## 2016-04-24 DIAGNOSIS — H43813 Vitreous degeneration, bilateral: Secondary | ICD-10-CM | POA: Diagnosis not present

## 2016-04-24 DIAGNOSIS — H353222 Exudative age-related macular degeneration, left eye, with inactive choroidal neovascularization: Secondary | ICD-10-CM | POA: Diagnosis not present

## 2016-04-24 DIAGNOSIS — H35371 Puckering of macula, right eye: Secondary | ICD-10-CM | POA: Diagnosis not present

## 2016-07-25 DIAGNOSIS — E7800 Pure hypercholesterolemia, unspecified: Secondary | ICD-10-CM | POA: Diagnosis not present

## 2016-08-13 DIAGNOSIS — E7800 Pure hypercholesterolemia, unspecified: Secondary | ICD-10-CM | POA: Diagnosis not present

## 2016-10-23 DIAGNOSIS — H35371 Puckering of macula, right eye: Secondary | ICD-10-CM | POA: Diagnosis not present

## 2016-10-23 DIAGNOSIS — H353222 Exudative age-related macular degeneration, left eye, with inactive choroidal neovascularization: Secondary | ICD-10-CM | POA: Diagnosis not present

## 2016-10-23 DIAGNOSIS — H43813 Vitreous degeneration, bilateral: Secondary | ICD-10-CM | POA: Diagnosis not present

## 2016-10-23 DIAGNOSIS — H353112 Nonexudative age-related macular degeneration, right eye, intermediate dry stage: Secondary | ICD-10-CM | POA: Diagnosis not present

## 2017-02-20 DIAGNOSIS — E7800 Pure hypercholesterolemia, unspecified: Secondary | ICD-10-CM | POA: Diagnosis not present

## 2017-02-20 DIAGNOSIS — R011 Cardiac murmur, unspecified: Secondary | ICD-10-CM | POA: Diagnosis not present

## 2017-03-25 DIAGNOSIS — R011 Cardiac murmur, unspecified: Secondary | ICD-10-CM | POA: Diagnosis not present

## 2017-03-25 DIAGNOSIS — Z0189 Encounter for other specified special examinations: Secondary | ICD-10-CM | POA: Diagnosis not present

## 2017-03-25 DIAGNOSIS — E7800 Pure hypercholesterolemia, unspecified: Secondary | ICD-10-CM | POA: Diagnosis not present

## 2017-03-25 DIAGNOSIS — R9431 Abnormal electrocardiogram [ECG] [EKG]: Secondary | ICD-10-CM | POA: Diagnosis not present

## 2017-04-01 DIAGNOSIS — I10 Essential (primary) hypertension: Secondary | ICD-10-CM | POA: Diagnosis not present

## 2017-04-01 DIAGNOSIS — R0602 Shortness of breath: Secondary | ICD-10-CM | POA: Diagnosis not present

## 2017-04-01 DIAGNOSIS — R9431 Abnormal electrocardiogram [ECG] [EKG]: Secondary | ICD-10-CM | POA: Diagnosis not present

## 2017-04-23 DIAGNOSIS — H353222 Exudative age-related macular degeneration, left eye, with inactive choroidal neovascularization: Secondary | ICD-10-CM | POA: Diagnosis not present

## 2017-04-23 DIAGNOSIS — H35371 Puckering of macula, right eye: Secondary | ICD-10-CM | POA: Diagnosis not present

## 2017-04-23 DIAGNOSIS — H353112 Nonexudative age-related macular degeneration, right eye, intermediate dry stage: Secondary | ICD-10-CM | POA: Diagnosis not present

## 2017-04-23 DIAGNOSIS — H43813 Vitreous degeneration, bilateral: Secondary | ICD-10-CM | POA: Diagnosis not present

## 2017-04-25 DIAGNOSIS — R011 Cardiac murmur, unspecified: Secondary | ICD-10-CM | POA: Diagnosis not present

## 2017-05-06 DIAGNOSIS — I10 Essential (primary) hypertension: Secondary | ICD-10-CM | POA: Diagnosis not present

## 2017-05-06 DIAGNOSIS — I341 Nonrheumatic mitral (valve) prolapse: Secondary | ICD-10-CM | POA: Diagnosis not present

## 2017-05-06 DIAGNOSIS — R9431 Abnormal electrocardiogram [ECG] [EKG]: Secondary | ICD-10-CM | POA: Diagnosis not present

## 2017-05-06 DIAGNOSIS — Z0189 Encounter for other specified special examinations: Secondary | ICD-10-CM | POA: Diagnosis not present

## 2017-05-20 DIAGNOSIS — I10 Essential (primary) hypertension: Secondary | ICD-10-CM | POA: Diagnosis not present

## 2017-08-27 DIAGNOSIS — E7800 Pure hypercholesterolemia, unspecified: Secondary | ICD-10-CM | POA: Diagnosis not present

## 2017-08-27 DIAGNOSIS — Z0000 Encounter for general adult medical examination without abnormal findings: Secondary | ICD-10-CM | POA: Diagnosis not present

## 2017-08-27 DIAGNOSIS — Z23 Encounter for immunization: Secondary | ICD-10-CM | POA: Diagnosis not present

## 2017-08-27 DIAGNOSIS — I10 Essential (primary) hypertension: Secondary | ICD-10-CM | POA: Diagnosis not present

## 2017-10-29 DIAGNOSIS — H43813 Vitreous degeneration, bilateral: Secondary | ICD-10-CM | POA: Diagnosis not present

## 2017-10-29 DIAGNOSIS — H35371 Puckering of macula, right eye: Secondary | ICD-10-CM | POA: Diagnosis not present

## 2017-10-29 DIAGNOSIS — H353112 Nonexudative age-related macular degeneration, right eye, intermediate dry stage: Secondary | ICD-10-CM | POA: Diagnosis not present

## 2017-10-29 DIAGNOSIS — H353222 Exudative age-related macular degeneration, left eye, with inactive choroidal neovascularization: Secondary | ICD-10-CM | POA: Diagnosis not present

## 2017-11-07 DIAGNOSIS — I10 Essential (primary) hypertension: Secondary | ICD-10-CM | POA: Diagnosis not present

## 2017-11-07 DIAGNOSIS — R002 Palpitations: Secondary | ICD-10-CM | POA: Diagnosis not present

## 2017-11-07 DIAGNOSIS — I341 Nonrheumatic mitral (valve) prolapse: Secondary | ICD-10-CM | POA: Diagnosis not present

## 2017-11-07 DIAGNOSIS — R0989 Other specified symptoms and signs involving the circulatory and respiratory systems: Secondary | ICD-10-CM | POA: Diagnosis not present

## 2017-12-03 DIAGNOSIS — R0989 Other specified symptoms and signs involving the circulatory and respiratory systems: Secondary | ICD-10-CM | POA: Diagnosis not present

## 2017-12-06 DIAGNOSIS — R002 Palpitations: Secondary | ICD-10-CM | POA: Diagnosis not present

## 2017-12-20 DIAGNOSIS — I341 Nonrheumatic mitral (valve) prolapse: Secondary | ICD-10-CM | POA: Diagnosis not present

## 2017-12-20 DIAGNOSIS — I10 Essential (primary) hypertension: Secondary | ICD-10-CM | POA: Diagnosis not present

## 2017-12-20 DIAGNOSIS — R0989 Other specified symptoms and signs involving the circulatory and respiratory systems: Secondary | ICD-10-CM | POA: Diagnosis not present

## 2017-12-20 DIAGNOSIS — R002 Palpitations: Secondary | ICD-10-CM | POA: Diagnosis not present

## 2018-01-09 DIAGNOSIS — I341 Nonrheumatic mitral (valve) prolapse: Secondary | ICD-10-CM | POA: Diagnosis not present

## 2018-01-09 DIAGNOSIS — R002 Palpitations: Secondary | ICD-10-CM | POA: Diagnosis not present

## 2018-03-21 DIAGNOSIS — I10 Essential (primary) hypertension: Secondary | ICD-10-CM | POA: Diagnosis not present

## 2018-03-21 DIAGNOSIS — E7800 Pure hypercholesterolemia, unspecified: Secondary | ICD-10-CM | POA: Diagnosis not present

## 2018-03-21 DIAGNOSIS — J309 Allergic rhinitis, unspecified: Secondary | ICD-10-CM | POA: Diagnosis not present

## 2018-03-24 DIAGNOSIS — E785 Hyperlipidemia, unspecified: Secondary | ICD-10-CM | POA: Diagnosis not present

## 2018-05-06 DIAGNOSIS — H35371 Puckering of macula, right eye: Secondary | ICD-10-CM | POA: Diagnosis not present

## 2018-05-06 DIAGNOSIS — H3540 Unspecified peripheral retinal degeneration: Secondary | ICD-10-CM | POA: Diagnosis not present

## 2018-05-06 DIAGNOSIS — H353112 Nonexudative age-related macular degeneration, right eye, intermediate dry stage: Secondary | ICD-10-CM | POA: Diagnosis not present

## 2018-05-06 DIAGNOSIS — H353222 Exudative age-related macular degeneration, left eye, with inactive choroidal neovascularization: Secondary | ICD-10-CM | POA: Diagnosis not present

## 2018-08-19 DIAGNOSIS — H353112 Nonexudative age-related macular degeneration, right eye, intermediate dry stage: Secondary | ICD-10-CM | POA: Diagnosis not present

## 2018-08-19 DIAGNOSIS — H353222 Exudative age-related macular degeneration, left eye, with inactive choroidal neovascularization: Secondary | ICD-10-CM | POA: Diagnosis not present

## 2018-09-02 DIAGNOSIS — E7800 Pure hypercholesterolemia, unspecified: Secondary | ICD-10-CM | POA: Diagnosis not present

## 2018-09-02 DIAGNOSIS — M810 Age-related osteoporosis without current pathological fracture: Secondary | ICD-10-CM | POA: Diagnosis not present

## 2018-09-02 DIAGNOSIS — Z0000 Encounter for general adult medical examination without abnormal findings: Secondary | ICD-10-CM | POA: Diagnosis not present

## 2018-09-02 DIAGNOSIS — I10 Essential (primary) hypertension: Secondary | ICD-10-CM | POA: Diagnosis not present

## 2018-09-02 DIAGNOSIS — H6120 Impacted cerumen, unspecified ear: Secondary | ICD-10-CM | POA: Diagnosis not present

## 2018-09-02 DIAGNOSIS — I493 Ventricular premature depolarization: Secondary | ICD-10-CM | POA: Diagnosis not present

## 2018-09-02 DIAGNOSIS — Z23 Encounter for immunization: Secondary | ICD-10-CM | POA: Diagnosis not present

## 2018-09-02 DIAGNOSIS — E559 Vitamin D deficiency, unspecified: Secondary | ICD-10-CM | POA: Diagnosis not present

## 2018-09-03 DIAGNOSIS — I493 Ventricular premature depolarization: Secondary | ICD-10-CM | POA: Diagnosis not present

## 2018-09-03 DIAGNOSIS — M810 Age-related osteoporosis without current pathological fracture: Secondary | ICD-10-CM | POA: Diagnosis not present

## 2018-09-03 DIAGNOSIS — E559 Vitamin D deficiency, unspecified: Secondary | ICD-10-CM | POA: Diagnosis not present

## 2018-09-03 DIAGNOSIS — Z23 Encounter for immunization: Secondary | ICD-10-CM | POA: Diagnosis not present

## 2018-09-03 DIAGNOSIS — E7800 Pure hypercholesterolemia, unspecified: Secondary | ICD-10-CM | POA: Diagnosis not present

## 2018-09-03 DIAGNOSIS — Z0000 Encounter for general adult medical examination without abnormal findings: Secondary | ICD-10-CM | POA: Diagnosis not present

## 2018-09-05 ENCOUNTER — Other Ambulatory Visit: Payer: Self-pay | Admitting: Family Medicine

## 2018-09-05 DIAGNOSIS — M810 Age-related osteoporosis without current pathological fracture: Secondary | ICD-10-CM

## 2018-09-05 DIAGNOSIS — Z1231 Encounter for screening mammogram for malignant neoplasm of breast: Secondary | ICD-10-CM

## 2018-09-16 DIAGNOSIS — H6123 Impacted cerumen, bilateral: Secondary | ICD-10-CM | POA: Diagnosis not present

## 2018-09-16 DIAGNOSIS — H903 Sensorineural hearing loss, bilateral: Secondary | ICD-10-CM | POA: Diagnosis not present

## 2018-12-19 DIAGNOSIS — E785 Hyperlipidemia, unspecified: Secondary | ICD-10-CM | POA: Insufficient documentation

## 2018-12-19 DIAGNOSIS — I341 Nonrheumatic mitral (valve) prolapse: Secondary | ICD-10-CM

## 2018-12-19 DIAGNOSIS — E782 Mixed hyperlipidemia: Secondary | ICD-10-CM

## 2018-12-19 DIAGNOSIS — R002 Palpitations: Secondary | ICD-10-CM | POA: Insufficient documentation

## 2018-12-19 DIAGNOSIS — R0989 Other specified symptoms and signs involving the circulatory and respiratory systems: Secondary | ICD-10-CM

## 2018-12-22 ENCOUNTER — Ambulatory Visit (INDEPENDENT_AMBULATORY_CARE_PROVIDER_SITE_OTHER): Payer: Medicare Other | Admitting: Cardiology

## 2018-12-22 ENCOUNTER — Encounter: Payer: Self-pay | Admitting: Cardiology

## 2018-12-22 ENCOUNTER — Other Ambulatory Visit: Payer: Self-pay

## 2018-12-22 VITALS — BP 133/73 | HR 75 | Ht 62.0 in | Wt 105.4 lb

## 2018-12-22 DIAGNOSIS — I10 Essential (primary) hypertension: Secondary | ICD-10-CM

## 2018-12-22 DIAGNOSIS — E785 Hyperlipidemia, unspecified: Secondary | ICD-10-CM

## 2018-12-22 DIAGNOSIS — I341 Nonrheumatic mitral (valve) prolapse: Secondary | ICD-10-CM

## 2018-12-22 DIAGNOSIS — R002 Palpitations: Secondary | ICD-10-CM | POA: Diagnosis not present

## 2018-12-22 DIAGNOSIS — R0989 Other specified symptoms and signs involving the circulatory and respiratory systems: Secondary | ICD-10-CM | POA: Diagnosis not present

## 2018-12-22 NOTE — Progress Notes (Signed)
Primary Physician:  Clayborn Heronankins, Victoria R, MD   Patient ID: Deborah DenverWanda L Nocito, female    DOB: 04/08/1933, 85 y.o.   MRN: [id number]  Subjective:    Chief Complaint  Patient presents with  . Mitral Valve Prolapse  . Follow-up    HPI: Deborah Skinner  is a 85 y.o. female  with hypertension, mild hyperlipidemia, moderate MR due to MVP by echocardiogram in August 2018 and normal nuclear stress test at the same time due to abnormal EKG.   Event monitoring for recurrent episodes of palpitations, revealed occasional PVCs and PACs in April 2019, however she had one episode of NSVT at night. She is on metoprolol and lisinopril HCT for palpitations and MVP which she is tolerating. Occasional palpitations and occasional dizziness. No syncope or dyspnea.  Past Medical History:  Diagnosis Date  . HLD (hyperlipidemia)   . Hyperlipidemia   . Hypertension   . MVP (mitral valve prolapse)   . Palpitations   . Right carotid bruit     History reviewed. No pertinent surgical history.  Social History   Socioeconomic History  . Marital status: Widowed    Spouse name: Not on file  . Number of children: 2  . Years of education: Not on file  . Highest education level: Not on file  Occupational History  . Not on file  Social Needs  . Financial resource strain: Not on file  . Food insecurity:    Worry: Not on file    Inability: Not on file  . Transportation needs:    Medical: Not on file    Non-medical: Not on file  Tobacco Use  . Smoking status: Never Smoker  . Smokeless tobacco: Never Used  Substance and Sexual Activity  . Alcohol use: Yes    Comment: rarely  . Drug use: No  . Sexual activity: Never  Lifestyle  . Physical activity:    Days per week: Not on file    Minutes per session: Not on file  . Stress: Not on file  Relationships  . Social connections:    Talks on phone: Not on file    Gets together: Not on file    Attends religious service: Not on file    Active member  of club or organization: Not on file    Attends meetings of clubs or organizations: Not on file    Relationship status: Not on file  . Intimate partner violence:    Fear of current or ex partner: Not on file    Emotionally abused: Not on file    Physically abused: Not on file    Forced sexual activity: Not on file  Other Topics Concern  . Not on file  Social History Narrative  . Not on file    Review of Systems  Constitution: Negative for decreased appetite, malaise/fatigue, weight gain and weight loss.  Eyes: Negative for visual disturbance.  Cardiovascular: Positive for palpitations (occasional). Negative for chest pain, claudication, leg swelling, orthopnea and syncope.  Respiratory: Negative for hemoptysis and wheezing.   Endocrine: Negative for cold intolerance and heat intolerance.  Hematologic/Lymphatic: Does not bruise/bleed easily.  Skin: Negative for nail changes.  Musculoskeletal: Negative for muscle weakness and myalgias.  Gastrointestinal: Negative for abdominal pain, change in bowel habit, nausea and vomiting.  Neurological: Positive for dizziness (when she suddenly stands). Negative for difficulty with concentration, focal weakness and headaches.  Psychiatric/Behavioral: Negative for altered mental status and suicidal ideas.  All other systems reviewed and are  negative.     Objective:  Blood pressure 133/73, pulse 75, height 5\' 2"  (1.575 m), weight 105 lb 6.4 oz (47.8 kg), SpO2 97 %. Body mass index is 19.28 kg/m.    Physical Exam  Constitutional: She is oriented to person, place, and time. Vital signs are normal. She appears well-developed and well-nourished.  HENT:  Head: Normocephalic and atraumatic.  Neck: Normal range of motion.  Cardiovascular: Normal rate, regular rhythm and intact distal pulses.  Murmur heard.  Crescendo-decrescendo mid to late systolic murmur is present with a grade of 3/6 at the apex. Pulses:      Carotid pulses are on the right side  with bruit. Pulmonary/Chest: Effort normal and breath sounds normal. No accessory muscle usage. No respiratory distress.  Abdominal: Soft. Bowel sounds are normal.  Musculoskeletal: Normal range of motion.  Neurological: She is alert and oriented to person, place, and time.  Skin: Skin is warm and dry.  Vitals reviewed.  Radiology: No results found.  Laboratory examination:   PRN Meds:. There are no discontinued medications. Current Meds  Medication Sig  . atorvastatin (LIPITOR) 10 MG tablet Take 10 mg by mouth daily.  Marland Kitchen lisinopril-hydrochlorothiazide (ZESTORETIC) 10-12.5 MG tablet Take 1 tablet by mouth daily.  . metoprolol tartrate (LOPRESSOR) 25 MG tablet Take 25 mg by mouth 2 (two) times daily.  . Multiple Vitamin tablet Take 1 tablet by mouth daily.  . ranitidine (ZANTAC) 150 MG capsule Take 150 mg by mouth as needed.     Cardiac Studies:    Event Monitor 30 days (4/4 to 12/06/17)Unscheduled transmission 11/07/2017: 4 beat NSVT and one V-Couplet at 12:56 AM. No symptoms reported. Manually detected events include occasional PVC (7 %), rare PAC.  Carotid artery duplex 12/03/2017: No hemodynamically significant arterial disease in the internal carotid artery bilaterally. No significant plaque noted. Antegrade right vertebral artery flow. Antegrade left vertebral artery flow.  Echocardiogram 01/09/2018: Left ventricle cavity is normal in size. Normal global wall motion. Doppler evidence of grade I (impaired) diastolic dysfunction, normal LAP. Calculated EF 55%. Trileaflet aortic valve with trace regurgitation. Mild mitral valve leaflet thickening and myxomatous degeneration. Mild prolapse of the anterior mitral valve leaflet. Mild to moderate (Grade II) posteriorly directed mitral regurgitation. Mild tricuspid regurgitation. No evidence of pulmonary hypertension. Compared to the study done on 04/25/2017, no significant change.  Exercise myoview stress test 04/01/2017: 1. The  resting electrocardiogram demonstrated normal sinus rhythm and nonspecific ST-T changes with uplsoping ST depression in inferior and lateral leads, no resting arrhythmias. The stress electrocardiogram was normal. Stress symptoms included dyspnea. Patient exercised on modified Bruce protocol for 5:00 minutes and achieved 3.7 METS. Stress test terminated due to fatigue, dyspnea and 119% MPHR achieved (Target HR >85%). Hypertensive at rest and stress with peak BP 186/90 mm Hg. 2. Myocardial perfusion imaging is normal. Overall left ventricular systolic function was normal without regional wall motion abnormalities. The left ventricular ejection fraction was 72%.  Assessment:   MVP (mitral valve prolapse) - Plan: EKG 12-Lead  Essential hypertension  Right carotid bruit  Palpitations  Mild hyperlipidemia EKG 12/22/18: Sinus  Rhythm Left atrial enlargement.  IRBBB. No ischemia.    EKG 11/07/2017: Normal sinus rhythm at rate of 66 bpm, normal axis, no evidence of ischemia. One pair of atrial couplets. Compared to EKG 03/25/2017: Sinus tachycardia at 103 bpm, right atrial enlargement, 2 PVC, 1 mm ST depression in inferior and lateral leads, diffuse T wave flattening. Abnormal EKG  Recommendations:   Ms. Averie Devenney  is a pleasant Caucasian female who is presently 85 years of age, who is here on a 56-month office visit. She is doing well and essentially asymptomatic except for occasional palpitations suggestive of PVC and PAC.   Occasionall dizziness and she can hold zestril HCT on days she is working out in the yard. No change in MR and hypertension is controlled and lipids managed by PCP.  EKG shows no further PVC. OV in 1 year. No need for repeat echo for now. No endocarditis prophylaxis needed.   Yates Decamp, MD, [facility] 12/22/2018, 10:45 AM [facility] [place] Pager: [phone] Office: [phone] If no answer Cell [phone]

## 2019-03-25 DIAGNOSIS — H353112 Nonexudative age-related macular degeneration, right eye, intermediate dry stage: Secondary | ICD-10-CM | POA: Diagnosis not present

## 2019-03-25 DIAGNOSIS — H35371 Puckering of macula, right eye: Secondary | ICD-10-CM | POA: Diagnosis not present

## 2019-03-25 DIAGNOSIS — H43813 Vitreous degeneration, bilateral: Secondary | ICD-10-CM | POA: Diagnosis not present

## 2019-03-25 DIAGNOSIS — H353222 Exudative age-related macular degeneration, left eye, with inactive choroidal neovascularization: Secondary | ICD-10-CM | POA: Diagnosis not present

## 2019-05-06 DIAGNOSIS — M810 Age-related osteoporosis without current pathological fracture: Secondary | ICD-10-CM | POA: Diagnosis not present

## 2019-05-06 DIAGNOSIS — I10 Essential (primary) hypertension: Secondary | ICD-10-CM | POA: Diagnosis not present

## 2019-05-30 ENCOUNTER — Encounter (INDEPENDENT_AMBULATORY_CARE_PROVIDER_SITE_OTHER): Payer: Self-pay

## 2019-07-10 ENCOUNTER — Other Ambulatory Visit: Payer: Self-pay

## 2019-07-10 MED ORDER — METOPROLOL TARTRATE 25 MG PO TABS: 25.0000 mg | ORAL_TABLET | Freq: Two times a day (BID) | ORAL | 1 refills | Status: DC

## 2019-09-02 DIAGNOSIS — H35371 Puckering of macula, right eye: Secondary | ICD-10-CM | POA: Diagnosis not present

## 2019-09-02 DIAGNOSIS — H43813 Vitreous degeneration, bilateral: Secondary | ICD-10-CM | POA: Diagnosis not present

## 2019-09-02 DIAGNOSIS — H353112 Nonexudative age-related macular degeneration, right eye, intermediate dry stage: Secondary | ICD-10-CM | POA: Diagnosis not present

## 2019-09-02 DIAGNOSIS — H353222 Exudative age-related macular degeneration, left eye, with inactive choroidal neovascularization: Secondary | ICD-10-CM | POA: Diagnosis not present

## 2019-09-02 DIAGNOSIS — H25813 Combined forms of age-related cataract, bilateral: Secondary | ICD-10-CM | POA: Diagnosis not present

## 2019-09-09 DIAGNOSIS — E7800 Pure hypercholesterolemia, unspecified: Secondary | ICD-10-CM | POA: Diagnosis not present

## 2019-09-09 DIAGNOSIS — E559 Vitamin D deficiency, unspecified: Secondary | ICD-10-CM | POA: Diagnosis not present

## 2019-09-09 DIAGNOSIS — Z1211 Encounter for screening for malignant neoplasm of colon: Secondary | ICD-10-CM | POA: Diagnosis not present

## 2019-09-09 DIAGNOSIS — Z0000 Encounter for general adult medical examination without abnormal findings: Secondary | ICD-10-CM | POA: Diagnosis not present

## 2019-09-09 DIAGNOSIS — Z5181 Encounter for therapeutic drug level monitoring: Secondary | ICD-10-CM | POA: Diagnosis not present

## 2019-09-09 DIAGNOSIS — I10 Essential (primary) hypertension: Secondary | ICD-10-CM | POA: Diagnosis not present

## 2019-09-23 DIAGNOSIS — H3540 Unspecified peripheral retinal degeneration: Secondary | ICD-10-CM | POA: Diagnosis not present

## 2019-09-23 DIAGNOSIS — H353112 Nonexudative age-related macular degeneration, right eye, intermediate dry stage: Secondary | ICD-10-CM | POA: Diagnosis not present

## 2019-09-23 DIAGNOSIS — H35371 Puckering of macula, right eye: Secondary | ICD-10-CM | POA: Diagnosis not present

## 2019-09-23 DIAGNOSIS — H353222 Exudative age-related macular degeneration, left eye, with inactive choroidal neovascularization: Secondary | ICD-10-CM | POA: Diagnosis not present

## 2019-09-30 DIAGNOSIS — Z23 Encounter for immunization: Secondary | ICD-10-CM | POA: Diagnosis not present

## 2019-10-28 DIAGNOSIS — Z23 Encounter for immunization: Secondary | ICD-10-CM | POA: Diagnosis not present

## 2019-12-04 DIAGNOSIS — S51012A Laceration without foreign body of left elbow, initial encounter: Secondary | ICD-10-CM | POA: Diagnosis not present

## 2019-12-08 DIAGNOSIS — S51011D Laceration without foreign body of right elbow, subsequent encounter: Secondary | ICD-10-CM | POA: Diagnosis not present

## 2019-12-19 NOTE — Progress Notes (Signed)
Primary Physician/Referring:  Clayborn Heron, MD  Patient ID: Deborah Skinner, female    DOB: 1933-07-28, 86 y.o.   MRN: [id number]  Chief Complaint  Patient presents with  . Mitral Valve Prolapse  . Follow-up    1 year   HPI:    Deborah Skinner  is a 86 y.o. female  with hypertension, mild hyperlipidemia, palpitations, moderate MR due to MVP by echocardiogram in August 2018 and normal nuclear stress test at the same time.   She is on metoprolol and lisinopril HCT for palpitations and MVP which she is tolerating. Occasional palpitations and occasional dizziness. No syncope or dyspnea.   Past Medical History:  Diagnosis Date  . HLD (hyperlipidemia)   . Hyperlipidemia   . Hypertension   . MVP (mitral valve prolapse)   . Palpitations   . Right carotid bruit    History reviewed. No pertinent surgical history. History reviewed. No pertinent family history.  Social History   Tobacco Use  . Smoking status: Never Smoker  . Smokeless tobacco: Never Used  Substance Use Topics  . Alcohol use: Yes    Comment: rarely   Marital Status: Widowed  ROS  Review of Systems  Cardiovascular: Positive for palpitations (occasional). Negative for chest pain, dyspnea on exertion and leg swelling.  Gastrointestinal: Negative for melena.  Neurological: Positive for loss of balance.   Objective  Blood pressure (!) 156/73, pulse 77, resp. rate 17, height 5\' 2"  (1.575 m), weight 105 lb (47.6 kg), SpO2 98 %.  Vitals with BMI 12/23/2019 12/22/2018 12/28/2014  Height 5\' 2"  5\' 2"  -  Weight 105 lbs 105 lbs 6 oz -  BMI 19.2 19.27 -  Systolic 156 133 12/30/2014  Diastolic 73 73 86  Pulse 77 75 82      Physical Exam  Constitutional: Vital signs are normal. No distress.  Petite  Cardiovascular: Normal rate, regular rhythm and intact distal pulses. Exam reveals no gallop.  Murmur heard.  Crescendo-decrescendo mid to late systolic murmur is present with a grade of 3/6 at the apex. Mid systolic  click Pulses:      Carotid pulses are on the right side with bruit. No leg edema, no JVD.   Pulmonary/Chest: Effort normal and breath sounds normal. No accessory muscle usage. No respiratory distress.  Abdominal: Soft. Bowel sounds are normal.   Laboratory examination:   External labs:   Lipid Panel completed 09/09/2019 Cholesterol, total 232.000 09/09/2019 Triglycerides 189.000 09/09/2019 HDL 79.000 09/09/2019 LDL-C 11/07/2019 09/09/2019  Glucose Random 98.000 09/09/2019  BUN 15.000 09/09/2019 Creatinine, Serum 0.780 09/09/2019  TSH 1.790 08/27/2017  Medications and allergies  No Known Allergies   Current Outpatient Medications  Medication Instructions  . atorvastatin (LIPITOR) 10 mg, Oral, Daily  . lisinopril-hydrochlorothiazide (ZESTORETIC) 10-12.5 MG tablet 1 tablet, Oral, Daily  . metoprolol tartrate (LOPRESSOR) 25 mg, Oral, 2 times daily  . Multiple Vitamin tablet 1 tablet, Oral, Daily  . ranitidine (ZANTAC) 150 mg, Oral, As needed  . Vitamin D 2,000 Units, Oral, Daily   Radiology:   No results found.  Cardiac Studies:   Exercise myoview stress test 04/01/2017: 1. The resting electrocardiogram demonstrated normal sinus rhythm and nonspecific ST-T changes with uplsoping ST depression in inferior and lateral leads, no resting arrhythmias. The stress electrocardiogram was normal. Stress symptoms included dyspnea. Patient exercised on modified Bruce protocol for 5:00 minutes and achieved 3.7 METS. Stress test terminated due to fatigue, dyspnea and 119% MPHR achieved (Target HR >85%). Hypertensive at rest  and stress with peak BP 186/90 mm Hg. 2. Myocardial perfusion imaging is normal. Overall left ventricular systolic function was normal without regional wall motion abnormalities. The left ventricular ejection fraction was 72%.  Event Monitor 30 days (4/4 to 12/06/17)  Unscheduled transmission 11/07/2017: 4 beat NSVT and one V-Couplet at 12:56 AM. No symptoms reported. Manually detected  events include occasional PVC (7 %), rare PAC.  Carotid artery duplex 12/03/2017: No hemodynamically significant arterial disease in the internal carotid artery bilaterally. No significant plaque noted. Antegrade right vertebral artery flow. Antegrade left vertebral artery flow.  Echocardiogram 01/09/2018: Left ventricle cavity is normal in size. Normal global wall motion. Doppler evidence of grade I (impaired) diastolic dysfunction, normal LAP. Calculated EF 55%. Trileaflet aortic valve with trace regurgitation. Mild mitral valve leaflet thickening and myxomatous degeneration. Mild prolapse of the anterior mitral valve leaflet. Mild to moderate (Grade II) posteriorly directed mitral regurgitation. Mild tricuspid regurgitation. No evidence of pulmonary hypertension. Compared to the study done on 04/25/2017, no significant change.   EKG  EKG 12/23/2019: Normal sinus rhythm with rate of 66 bpm, leftward axis, incomplete right bundle branch block.  Poor R wave progression, probably normal variant. No significant change from 11/07/2017.  Assessment     ICD-10-CM   1. MVP (mitral valve prolapse)  I34.1 EKG 12-Lead  2. Essential hypertension  I10   3. Mild hyperlipidemia  E78.5      No orders of the defined types were placed in this encounter.   There are no discontinued medications.  Recommendations:   Deborah Skinner  is a 86 y.o. female  with hypertension, mild hyperlipidemia, palpitations, moderate MR due to MVP by echocardiogram in August 2018 and normal nuclear stress test at the same time.  Except for occasional loss of balance, no specific complaints today.  Mitral valve regurgitation appears slightly more prominent, will repeat echocardiogram. Blood pressure is elevated in spite of waiting for a while.  I have added amlodipine 5 mg daily, I would like to see her back in 6 weeks for follow-up of hypertension along with repeat echocardiogram.  She is also noticed her blood pressure  to be elevated at home.  External labs reviewed. Hyperlipidemia is mild, she is presently 86 years of age, otherwise no known vascular risk factors.  Continue observation for now.  No endocarditis prophylaxis needed.   Adrian Prows, MD, [facility] 12/23/2019, 1:17 PM [facility] Pager: [phone] Office: [phone]

## 2019-12-23 ENCOUNTER — Other Ambulatory Visit: Payer: Self-pay

## 2019-12-23 ENCOUNTER — Encounter: Payer: Self-pay | Admitting: Cardiology

## 2019-12-23 ENCOUNTER — Ambulatory Visit: Payer: Medicare Other | Admitting: Cardiology

## 2019-12-23 VITALS — BP 156/73 | HR 77 | Resp 17 | Ht 62.0 in | Wt 105.0 lb

## 2019-12-23 DIAGNOSIS — I341 Nonrheumatic mitral (valve) prolapse: Secondary | ICD-10-CM | POA: Diagnosis not present

## 2019-12-23 DIAGNOSIS — I10 Essential (primary) hypertension: Secondary | ICD-10-CM | POA: Diagnosis not present

## 2019-12-23 DIAGNOSIS — I340 Nonrheumatic mitral (valve) insufficiency: Secondary | ICD-10-CM

## 2019-12-23 DIAGNOSIS — E785 Hyperlipidemia, unspecified: Secondary | ICD-10-CM | POA: Diagnosis not present

## 2019-12-23 MED ORDER — AMLODIPINE BESYLATE 5 MG PO TABS: 5.0000 mg | ORAL_TABLET | Freq: Every day | ORAL | 2 refills | Status: DC

## 2019-12-23 NOTE — Patient Instructions (Signed)
Mitral Valve Prolapse  Mitral valve prolapse is a heart condition involving the mitral valve. This is the valve between the upper chamber (atrium) and the lower chamber (ventricle) on the left side of the heart. Normally, the mitral valve allows blood to flow from the atrium to the ventricle and then seals off the chambers from one another. If you have mitral valve prolapse, the valve does not work the way that it should. The flaps of the mitral valve do not form a tight seal between the atrium and the ventricle. When this happens, blood can flow the wrong way (mitral valve regurgitation). This causes symptoms of mitral valve prolapse. This condition may develop if:  The valve opening stretches abnormally.  The mitral valve flaps are larger and thicker than normal.  The valve flaps flop or bulge more than they should.  One or more of the string-like connections between the valve flaps and the left ventricular muscle (chordae tendineae) become abnormally elongated or rupture. What are the causes? The exact cause of this condition is not known. In some cases, it may be passed down (inherited) from a family member who also had the condition. It can also be a complication of other diseases. What increases the risk? You are more likely to develop this condition if you have:  A family history of mitral valve prolapse.  An infection in the lining or valves of the heart (endocarditis).  Muscular dystrophy.  Graves' disease.  Scoliosis.  A connective tissue disorder. What are the signs or symptoms? Symptoms of this condition include:  A fast or irregular heartbeat (palpitations).  Chest pain.  Dizziness.  Shortness of breath.  Anxiety.  Fatigue. In some cases, there are no symptoms for this condition. How is this diagnosed? This condition may be diagnosed based on:  Your symptoms and medical history.  A physical exam, which includes listening to your heart with a stethoscope  for the presence of a murmur or a sound like a click.  Imaging studies of your heart, such as: ? X-rays. These check for fluid in the lungs. ? A test that uses sound waves to show the size of your heart and how well it pumps (echocardiogram). ? A test that uses sound waves to take pictures of the blood flow through your valve (Doppler ultrasound). ? A test that records the electrical activity of your heart (electrocardiogram, or ECG). ? A test that looks at the structure and function of the heart (cardiac catheterization). ? An MRI. How is this treated? Treatment for this condition depends on the cause and how severe your symptoms are. Treatment may include:  Medicines. You may need to take: ? Beta blockers. These help with chest discomfort and palpitations. ? Antibiotics to treat endocarditis. ? Vasodilators. These improve forward blood flow through the valve, if it is leaking. ? Water pills (diuretics). These get rid of any extra fluid that is present. ? Blood thinners. Some people with mitral valve prolapse develop a type of irregular or rapid heartbeat (atrial fibrillation) and need to take blood thinners to prevent stroke.  Surgery to repair or replace the mitral valve.  A procedure where a catheter-based device is used to repair the mitral valve. If you do not have symptoms, you may not need treatment. Many people only need regular follow-up visits with a heart specialist (cardiologist). Follow these instructions at home: Lifestyle  Maintain a healthy weight.  Regular exercise is important for the health of your heart and for maintaining a  healthy weight. Ask your health care provider what type of exercise is safe for you.  Do not use any products that contain nicotine or tobacco, such as cigarettes, e-cigarettes, and chewing tobacco. If you need help quitting, ask your health care provider. Eating and drinking   Eat a heart-healthy diet that includes whole grains, fruits and  vegetables, low-fat (lean) proteins, and low-fat or nonfat dairy products.  Do not drink alcohol if: ? Your health care provider tells you not to drink. ? You are pregnant, may be pregnant, or are planning to become pregnant.  If you drink alcohol: ? Limit how much you use to:  0-1 drink a day for women.  0-2 drinks a day for men. ? Be aware of how much alcohol is in your drink. In the [place] one drink equals one 12 oz bottle of beer (355 mL), one 5 oz glass of wine (148 mL), or one 1 oz glass of hard liquor (44 mL). Oral health  Brush and floss your teeth every day. See a dentist regularly. Having unhealthy teeth and gums may make endocarditis more likely.  Tell your dentist if you have a mitral valve prolapse due to a history of endocarditis. You may need to take antibiotics to prevent a valve infection before you have dental procedures. General instructions  Take over-the-counter and prescription medicines only as told by your health care provider.  Keep all follow-up visits as told by your health care provider. This is important. Contact a health care provider if:  You have palpitations.  You are often very tired.  You have a cough that will not go away.  Your symptoms of mitral valve prolapse begin to get worse. Get help right away if you:  Have chest pain or shortness of breath.  Faint.  Start to have chills, body aches, and a fever. These symptoms may represent a serious problem that is an emergency. Do not wait to see if the symptoms will go away. Get medical help right away. Call your local emergency services (911 in the [place] Do not drive yourself to the hospital. Summary  Mitral valve prolapse is a heart condition involving the mitral valve. This is the valve between the upper chamber (atrium) and the lower chamber (ventricle) on the left side of the heart.  If you do not have symptoms, you may not need treatment. Many people only need regular follow-up visits  with a heart specialist (cardiologist).  Tell your dentist if you have a mitral valve prolapse due to a history of endocarditis. You may need to take antibiotics to prevent a valve infection before you have dental procedures. This information is not intended to replace advice given to you by your health care provider. Make sure you discuss any questions you have with your health care provider. Document Revised: 07/06/2018 Document Reviewed: 07/06/2018 Elsevier Patient Education  2020 [employer].  

## 2020-01-18 ENCOUNTER — Ambulatory Visit: Payer: Medicare Other

## 2020-01-18 ENCOUNTER — Other Ambulatory Visit: Payer: Self-pay

## 2020-01-18 DIAGNOSIS — I340 Nonrheumatic mitral (valve) insufficiency: Secondary | ICD-10-CM

## 2020-01-18 DIAGNOSIS — I341 Nonrheumatic mitral (valve) prolapse: Secondary | ICD-10-CM

## 2020-01-19 NOTE — Progress Notes (Signed)
Moderate MVP and MR. NO change from 01/09/18

## 2020-02-04 ENCOUNTER — Encounter: Payer: Self-pay | Admitting: Cardiology

## 2020-02-04 ENCOUNTER — Other Ambulatory Visit: Payer: Self-pay

## 2020-02-04 ENCOUNTER — Ambulatory Visit: Payer: Medicare Other | Admitting: Cardiology

## 2020-02-04 VITALS — BP 121/72 | HR 63 | Ht 62.0 in | Wt 103.4 lb

## 2020-02-04 DIAGNOSIS — I10 Essential (primary) hypertension: Secondary | ICD-10-CM

## 2020-02-04 DIAGNOSIS — I340 Nonrheumatic mitral (valve) insufficiency: Secondary | ICD-10-CM

## 2020-02-04 DIAGNOSIS — I341 Nonrheumatic mitral (valve) prolapse: Secondary | ICD-10-CM

## 2020-02-04 MED ORDER — AMLODIPINE BESYLATE 5 MG PO TABS: 5.0000 mg | ORAL_TABLET | Freq: Every day | ORAL | 3 refills | Status: DC

## 2020-02-04 NOTE — Progress Notes (Signed)
Primary Physician/Referring:  Clayborn Heron, MD  Patient ID: Deborah Skinner, female    DOB: 1933-01-20, 86 y.o.   MRN: [id number]  Chief Complaint  Patient presents with  . Mitral Valve Prolapse  . Hypertension  . Follow-up    6 week and Echo results   HPI:    Deborah Skinner  is a 86 y.o. female  with hypertension, mild hyperlipidemia, palpitations, moderate MR due to MVP by echocardiogram in August 2018 and normal nuclear stress test at the same time.   She is on metoprolol and lisinopril HCT for palpitations and MVP which she is tolerating. Occasional palpitations and occasional dizziness. No syncope or dyspnea.   Past Medical History:  Diagnosis Date  . HLD (hyperlipidemia)   . Hyperlipidemia   . Hypertension   . MVP (mitral valve prolapse)   . Palpitations   . Right carotid bruit    History reviewed. No pertinent surgical history. History reviewed. No pertinent family history.  Social History   Tobacco Use  . Smoking status: Never Smoker  . Smokeless tobacco: Never Used  Substance Use Topics  . Alcohol use: Yes    Comment: rarely   Marital Status: Widowed  ROS  Review of Systems  Cardiovascular: Positive for palpitations (occasional). Negative for chest pain, dyspnea on exertion and leg swelling.  Gastrointestinal: Negative for melena.  Neurological: Positive for loss of balance.   Objective  Blood pressure 121/72, pulse 63, height 5\' 2"  (1.575 m), weight 103 lb 6.4 oz (46.9 kg), SpO2 98 %.  Vitals with BMI 02/04/2020 12/23/2019 12/22/2018  Height 5\' 2"  5\' 2"  5\' 2"   Weight 103 lbs 6 oz 105 lbs 105 lbs 6 oz  BMI 18.91 19.2 19.27  Systolic 121 156 12/24/2018  Diastolic 72 73 73  Pulse 63 77 75      Physical Exam Constitutional:      General: She is not in acute distress.    Comments: Petite  Cardiovascular:     Rate and Rhythm: Normal rate and regular rhythm.     Pulses: Intact distal pulses.          Carotid pulses are on the right side with  bruit.    Heart sounds: Murmur heard.  Crescendo-decrescendo mid to late systolic murmur is present with a grade of 3/6 at the apex. Mid systolic click   No gallop.      Comments: No leg edema, no JVD.  Pulmonary:     Effort: Pulmonary effort is normal. No accessory muscle usage or respiratory distress.     Breath sounds: Normal breath sounds.  Abdominal:     General: Bowel sounds are normal.     Palpations: Abdomen is soft.    Laboratory examination:   External labs:   Lipid Panel completed 09/09/2019 Cholesterol, total 232.000 09/09/2019 Triglycerides 189.000 09/09/2019 HDL 79.000 09/09/2019 LDL-C 11/07/2019 09/09/2019  Glucose Random 98.000 09/09/2019  BUN 15.000 09/09/2019 Creatinine, Serum 0.780 09/09/2019  TSH 1.790 08/27/2017  Medications and allergies  No Known Allergies   Current Outpatient Medications  Medication Instructions  . amLODipine (NORVASC) 5 mg, Oral, Daily  . atorvastatin (LIPITOR) 10 mg, Oral, Daily  . lisinopril-hydrochlorothiazide (ZESTORETIC) 10-12.5 MG tablet 1 tablet, Oral, Daily  . metoprolol tartrate (LOPRESSOR) 25 mg, Oral, 2 times daily  . Multiple Vitamin tablet 1 tablet, Oral, Daily  . ranitidine (ZANTAC) 150 mg, Oral, As needed  . Vitamin D 2,000 Units, Oral, Daily   Radiology:   No results found.  Cardiac Studies:   Exercise myoview stress test 04/01/2017: 1. The resting electrocardiogram demonstrated normal sinus rhythm and nonspecific ST-T changes with uplsoping ST depression in inferior and lateral leads, no resting arrhythmias. The stress electrocardiogram was normal. Stress symptoms included dyspnea. Patient exercised on modified Bruce protocol for 5:00 minutes and achieved 3.7 METS. Stress test terminated due to fatigue, dyspnea and 119% MPHR achieved (Target HR >85%). Hypertensive at rest and stress with peak BP 186/90 mm Hg. 2. Myocardial perfusion imaging is normal. Overall left ventricular systolic function was normal without regional wall  motion abnormalities. The left ventricular ejection fraction was 72%.  Event Monitor 30 days (4/4 to 12/06/17) Unscheduled transmission 11/07/2017: 4 beat NSVT and one V-Couplet at 12:56 AM. No symptoms reported. Manually detected events include occasional PVC (7 %), rare PAC.  Carotid artery duplex 12/03/2017: No hemodynamically significant arterial disease in the internal carotid artery bilaterally. No significant plaque noted. Antegrade right vertebral artery flow. Antegrade left vertebral artery flow.  Echocardiogram 01/19/2020:  Left ventricle cavity is normal in size and wall thickness. Normal global wall motion. Normal LV systolic function with EF 62%. Doppler evidence of  grade I (impaired) diastolic dysfunction, normal LAP.  Mild bileaflet prolapse with moderate (Grade II) mitral regurgitation.  Moderate tricuspid regurgitation.  No evidence of pulmonary hypertension.  No significant change compared to previous study on 01/09/2018.  EKG  EKG 12/23/2019: Normal sinus rhythm with rate of 66 bpm, leftward axis, incomplete right bundle branch block.  Poor R wave progression, probably normal variant. No significant change from 11/07/2017.  Assessment     ICD-10-CM   1. Essential hypertension  I10 amLODipine (NORVASC) 5 MG tablet  2. MVP (mitral valve prolapse)  I34.1   3. Moderate mitral regurgitation  I34.0      Meds ordered this encounter  Medications  . amLODipine (NORVASC) 5 MG tablet    Sig: Take 1 tablet (5 mg total) by mouth daily.    Dispense:  90 tablet    Refill:  3    Medications Discontinued During This Encounter  Medication Reason  . amLODipine (NORVASC) 5 MG tablet Reorder    Recommendations:   Deborah Skinner  is a 86 y.o. female  with hypertension, mild hyperlipidemia, palpitations, moderate MR due to MVP by echocardiogram in August 2018 and normal nuclear stress test at the same time.  Except for occasional loss of balance, no specific complaints today.  On  her last office visit I had added amlodipine 5 mg daily for hypertension, which she is tolerating without side effects.  Blood pressure is now well controlled.  I reviewed the results of the echocardiogram, fortunately mitral regurgitation has remained stable.  There is no clinical evidence of pulmonary hypertension or right-sided heart failure as well.  I simply reassured her.  External labs reviewed. Hyperlipidemia is mild, she is presently 86 years of age, otherwise no known vascular risk factors.  Continue observation for now.  No endocarditis prophylaxis needed.  I will see her back in 1 year or sooner if problems.   Deborah Decamp, MD, [facility] 02/07/2020, 4:43 PM Office: [phone]

## 2020-03-18 DIAGNOSIS — H353222 Exudative age-related macular degeneration, left eye, with inactive choroidal neovascularization: Secondary | ICD-10-CM | POA: Diagnosis not present

## 2020-03-18 DIAGNOSIS — H43813 Vitreous degeneration, bilateral: Secondary | ICD-10-CM | POA: Diagnosis not present

## 2020-03-18 DIAGNOSIS — H35371 Puckering of macula, right eye: Secondary | ICD-10-CM | POA: Diagnosis not present

## 2020-03-18 DIAGNOSIS — H353112 Nonexudative age-related macular degeneration, right eye, intermediate dry stage: Secondary | ICD-10-CM | POA: Diagnosis not present

## 2020-06-15 DIAGNOSIS — H353112 Nonexudative age-related macular degeneration, right eye, intermediate dry stage: Secondary | ICD-10-CM | POA: Diagnosis not present

## 2020-06-15 DIAGNOSIS — H353222 Exudative age-related macular degeneration, left eye, with inactive choroidal neovascularization: Secondary | ICD-10-CM | POA: Diagnosis not present

## 2020-08-23 ENCOUNTER — Ambulatory Visit (INDEPENDENT_AMBULATORY_CARE_PROVIDER_SITE_OTHER): Payer: Medicare Other | Admitting: Orthopaedic Surgery

## 2020-08-23 ENCOUNTER — Other Ambulatory Visit: Payer: Self-pay

## 2020-08-23 DIAGNOSIS — S52501A Unspecified fracture of the lower end of right radius, initial encounter for closed fracture: Secondary | ICD-10-CM | POA: Diagnosis not present

## 2020-08-23 DIAGNOSIS — S52531A Colles' fracture of right radius, initial encounter for closed fracture: Secondary | ICD-10-CM

## 2020-08-23 MED ORDER — HYDROCODONE-ACETAMINOPHEN 5-325 MG PO TABS: 1.0000 | ORAL_TABLET | Freq: Four times a day (QID) | ORAL | 0 refills | Status: DC | PRN

## 2020-08-23 MED ORDER — ONDANSETRON HCL 4 MG PO TABS: 4.0000 mg | ORAL_TABLET | Freq: Three times a day (TID) | ORAL | 0 refills | Status: DC | PRN

## 2020-08-23 NOTE — Progress Notes (Signed)
Office Visit Note   Patient: Deborah Skinner           Date of Birth: [birth year]           MRN: [id number] Visit Date: 08/23/2020              Requested by: Marva Panda, NP [address] [place],  [place] [address] PCP: Marva Panda, NP   Assessment & Plan: Visit Diagnoses:  1. Closed Colles' fracture of right radius, initial encounter     Plan: Impression is right wrist Colles' fracture.  She appears to be compensating for this injury quite well and using her wrist and hand without severe pain.  I think that she'll do well with nonoperative treatment as long as the fracture doesn't worsen.  She would like to avoid surgery if possible as well.  We agreed to attempt nonoperative treatment.  We've placed her in a short arm cast.  she will keep this elevated at all times.  We will have her come in next Tuesday for repeat evaluation and 3 view x-rays of the right wrist.  We did discuss the potential for surgical intervention if indicated.  Follow-Up Instructions: Return in about 1 week (around 08/30/2020).   Orders:  No orders of the defined types were placed in this encounter.  Meds ordered this encounter  Medications  . HYDROcodone-acetaminophen (NORCO) 5-325 MG tablet    Sig: Take 1 tablet by mouth 4 (four) times daily as needed.    Dispense:  30 tablet    Refill:  0  . ondansetron (ZOFRAN) 4 MG tablet    Sig: Take 1 tablet (4 mg total) by mouth every 8 (eight) hours as needed for nausea or vomiting.    Dispense:  40 tablet    Refill:  0      Procedures: No procedures performed   Clinical Data: No additional findings.   Subjective: Chief Complaint  Patient presents with  . Right Wrist - Pain    HPI patient is a pleasant 87 year old right-hand-dominant female who comes in today following an injury to her right wrist.  Earlier today, she slipped and fell landing on her right outstretched hand.  She was seen in an outpatient setting where x-rays were  obtained.  X-rays demonstrated displaced distal radius fracture.  She was placed in a sling and comes in today for further evaluation treatment recommendation.  Review of Systems as detailed in HPI.  All others reviewed and are negative.   Objective: Vital Signs: There were no vitals taken for this visit.  Physical Exam well-developed well-nourished female no acute distress.  Alert and oriented x3.  Ortho Exam right wrist exam shows mild swelling.  She does have ecchymosis to the volar aspect.  Mild to moderate tenderness mainly to the fracture site.  She is neurovascularly intact distally.  She actually moves her hand and wrist without much discomfort.   Specialty Comments:  No specialty comments available.  Imaging: X-rays reviewed by me from external disc reveal a dorsally displaced distal radius fracture   PMFS History: Patient Active Problem List   Diagnosis Date Noted  . MVP (mitral valve prolapse)   . Palpitations   . Right carotid bruit   . HLD (hyperlipidemia)    Past Medical History:  Diagnosis Date  . HLD (hyperlipidemia)   . Hyperlipidemia   . Hypertension   . MVP (mitral valve prolapse)   . Palpitations   . Right carotid bruit  No family history on file.  No past surgical history on file. Social History   Occupational History  . Not on file  Tobacco Use  . Smoking status: Never Smoker  . Smokeless tobacco: Never Used  Vaping Use  . Vaping Use: Never used  Substance and Sexual Activity  . Alcohol use: Yes    Comment: rarely  . Drug use: No  . Sexual activity: Never

## 2020-08-30 ENCOUNTER — Ambulatory Visit (INDEPENDENT_AMBULATORY_CARE_PROVIDER_SITE_OTHER): Payer: Medicare Other

## 2020-08-30 ENCOUNTER — Encounter: Payer: Self-pay | Admitting: Orthopaedic Surgery

## 2020-08-30 ENCOUNTER — Ambulatory Visit (INDEPENDENT_AMBULATORY_CARE_PROVIDER_SITE_OTHER): Payer: Medicare Other | Admitting: Orthopaedic Surgery

## 2020-08-30 ENCOUNTER — Other Ambulatory Visit: Payer: Self-pay

## 2020-08-30 DIAGNOSIS — S52531A Colles' fracture of right radius, initial encounter for closed fracture: Secondary | ICD-10-CM

## 2020-08-30 NOTE — Progress Notes (Signed)
     Patient: Deborah Skinner           Date of Birth: 10-08-1932           MRN: [id number] Visit Date: 08/30/2020 PCP: Marva Panda, NP   Assessment & Plan:  Chief Complaint:  Chief Complaint  Patient presents with  . Right Wrist - Pain   Visit Diagnoses:  1. Closed Colles' fracture of right radius, initial encounter     Plan:   Deborah Skinner returns today for follow-up of distal radius fracture.  She complains about having the cast.  Right wrist and forearm exam show moderate ecchymosis.  Minimal swelling.  No significant clinical deformities.  X-rays demonstrate stable alignment of the extra-articular distal radius fracture with mild dorsal angulation.  Minimal radial height loss.  His x-rays are stable compared to the ones from last week.  Again we discussed the pros and cons of surgery versus casting.  We reapplied the cast today.  She understands the downsides to casting but she definitely wants to avoid surgery.  Questions encouraged and answered.  Follow-up next week with two-view x-rays of the right wrist in the cast.  She does not need to have the cast removed at the next visit.  Follow-Up Instructions: Return in about 1 week (around 09/06/2020).   Orders:  Orders Placed This Encounter  Procedures  . XR Wrist Complete Right   No orders of the defined types were placed in this encounter.   Imaging: XR Wrist Complete Right  Result Date: 08/30/2020 Stable alignment of extra-articular distal radius fracture.  No interval worsening of the fracture.   PMFS History: Patient Active Problem List   Diagnosis Date Noted  . MVP (mitral valve prolapse)   . Palpitations   . Right carotid bruit   . HLD (hyperlipidemia)    Past Medical History:  Diagnosis Date  . HLD (hyperlipidemia)   . Hyperlipidemia   . Hypertension   . MVP (mitral valve prolapse)   . Palpitations   . Right carotid bruit     History reviewed. No pertinent family history.  History reviewed. No  pertinent surgical history. Social History   Occupational History  . Not on file  Tobacco Use  . Smoking status: Never Smoker  . Smokeless tobacco: Never Used  Vaping Use  . Vaping Use: Never used  Substance and Sexual Activity  . Alcohol use: Yes    Comment: rarely  . Drug use: No  . Sexual activity: Never

## 2020-09-06 ENCOUNTER — Ambulatory Visit (INDEPENDENT_AMBULATORY_CARE_PROVIDER_SITE_OTHER): Payer: Medicare Other

## 2020-09-06 ENCOUNTER — Other Ambulatory Visit: Payer: Self-pay

## 2020-09-06 ENCOUNTER — Encounter: Payer: Self-pay | Admitting: Orthopaedic Surgery

## 2020-09-06 ENCOUNTER — Ambulatory Visit (INDEPENDENT_AMBULATORY_CARE_PROVIDER_SITE_OTHER): Payer: Medicare Other | Admitting: Orthopaedic Surgery

## 2020-09-06 DIAGNOSIS — S52531A Colles' fracture of right radius, initial encounter for closed fracture: Secondary | ICD-10-CM

## 2020-09-06 NOTE — Progress Notes (Signed)
   Office Visit Note   Patient: Deborah Skinner           Date of Birth: 1933/07/14           MRN: [id number] Visit Date: 09/06/2020              Requested by: Marva Panda, NP [address] [place],  [place] [address] PCP: Marva Panda, NP   Assessment & Plan: Visit Diagnoses:  1. Closed Colles' fracture of right radius, initial encounter     Plan: Impression is 2-week status post right wrist Colles' fracture with acceptable alignment on today's x-rays.  We will continue to treat this nonoperatively.  She will continue with her short arm cast for another 2 weeks.  She will follow up with [place] at that point for repeat evaluation and 3 view x-rays of the right wrist.  Call with concerns or questions in the meantime.  Follow-Up Instructions: Return in about 2 weeks (around 09/20/2020).   Orders:  Orders Placed This Encounter  Procedures  . XR Wrist 2 Views Right   No orders of the defined types were placed in this encounter.     Procedures: No procedures performed   Clinical Data: No additional findings.   Subjective: Chief Complaint  Patient presents with  . Right Wrist - Pain    HPI patient is an 87 year old female who is here today with her son.  She is approximately 2 weeks out right wrist Colles' fracture.  She has been in a short arm cast.  She has minimal pain which is relieved with over-the-counter medication.     Objective: Vital Signs: There were no vitals taken for this visit.    Ortho Exam currently, her arm is in a short arm cast.  This does appear to be well fitting.  Her fingers are warm and well-perfused.  No swelling to the fingers.  She is neurovascular intact distally.  Specialty Comments:  No specialty comments available.  Imaging: XR Wrist 2 Views Right  Result Date: 09/06/2020 X-rays demonstrate distal radius fracture with acceptable alignment    PMFS History: Patient Active Problem List   Diagnosis Date Noted  . MVP  (mitral valve prolapse)   . Palpitations   . Right carotid bruit   . HLD (hyperlipidemia)    Past Medical History:  Diagnosis Date  . HLD (hyperlipidemia)   . Hyperlipidemia   . Hypertension   . MVP (mitral valve prolapse)   . Palpitations   . Right carotid bruit     History reviewed. No pertinent family history.  History reviewed. No pertinent surgical history. Social History   Occupational History  . Not on file  Tobacco Use  . Smoking status: Never Smoker  . Smokeless tobacco: Never Used  Vaping Use  . Vaping Use: Never used  Substance and Sexual Activity  . Alcohol use: Yes    Comment: rarely  . Drug use: No  . Sexual activity: Never

## 2020-09-13 DIAGNOSIS — R636 Underweight: Secondary | ICD-10-CM | POA: Diagnosis not present

## 2020-09-13 DIAGNOSIS — Z0000 Encounter for general adult medical examination without abnormal findings: Secondary | ICD-10-CM | POA: Diagnosis not present

## 2020-09-13 DIAGNOSIS — E7800 Pure hypercholesterolemia, unspecified: Secondary | ICD-10-CM | POA: Diagnosis not present

## 2020-09-13 DIAGNOSIS — R634 Abnormal weight loss: Secondary | ICD-10-CM | POA: Diagnosis not present

## 2020-09-13 DIAGNOSIS — I10 Essential (primary) hypertension: Secondary | ICD-10-CM | POA: Diagnosis not present

## 2020-09-20 ENCOUNTER — Encounter: Payer: Self-pay | Admitting: Orthopaedic Surgery

## 2020-09-20 ENCOUNTER — Ambulatory Visit (INDEPENDENT_AMBULATORY_CARE_PROVIDER_SITE_OTHER): Payer: Medicare Other

## 2020-09-20 ENCOUNTER — Ambulatory Visit (INDEPENDENT_AMBULATORY_CARE_PROVIDER_SITE_OTHER): Payer: Medicare Other | Admitting: Orthopaedic Surgery

## 2020-09-20 VITALS — Ht 62.0 in | Wt 103.0 lb

## 2020-09-20 DIAGNOSIS — S52531A Colles' fracture of right radius, initial encounter for closed fracture: Secondary | ICD-10-CM

## 2020-09-20 NOTE — Progress Notes (Signed)
   Office Visit Note   Patient: Deborah Skinner           Date of Birth: 11-Dec-1932           MRN: [id number] Visit Date: 09/20/2020              Requested by: Marva Panda, NP [address] [place],  [place] [address] PCP: Marva Panda, NP   Assessment & Plan: Visit Diagnoses:  1. Closed Colles' fracture of right radius, initial encounter     Plan: Impression is stable right distal radius fracture.  We will discontinue the cast and place her in a removable wrist brace which she will wear at all times except for times and hygiene.  Activity restrictions reviewed.  Recheck in 4 weeks with two-view x-rays of the right wrist.  We will assess if she needs hand therapy at that time.  Follow-Up Instructions: Return in about 4 weeks (around 10/18/2020).   Orders:  Orders Placed This Encounter  Procedures  . XR Wrist Complete Right   No orders of the defined types were placed in this encounter.     Procedures: No procedures performed   Clinical Data: No additional findings.   Subjective: Chief Complaint  Patient presents with  . Right Wrist - Follow-up    Right Colles fracture    Serenity returns today for follow-up of her right distal radius fracture.  She is 4 weeks status post injury.  Denies any pain.   Review of Systems   Objective: Vital Signs: Ht 5\' 2"  (1.575 m)   Wt 103 lb (46.7 kg)   BMI 18.84 kg/m   Physical Exam  Ortho Exam Right wrist shows no swelling.  Gentle range of motion is painless.  No neurovascular involvement.  Mild tenderness to palpation over the fracture site. Specialty Comments:  No specialty comments available.  Imaging: XR Wrist Complete Right  Result Date: 09/20/2020 Stable distal radius fracture in dorsal angulation.    PMFS History: Patient Active Problem List   Diagnosis Date Noted  . MVP (mitral valve prolapse)   . Palpitations   . Right carotid bruit   . HLD (hyperlipidemia)    Past Medical History:   Diagnosis Date  . HLD (hyperlipidemia)   . Hyperlipidemia   . Hypertension   . MVP (mitral valve prolapse)   . Palpitations   . Right carotid bruit     History reviewed. No pertinent family history.  History reviewed. No pertinent surgical history. Social History   Occupational History  . Not on file  Tobacco Use  . Smoking status: Never Smoker  . Smokeless tobacco: Never Used  Vaping Use  . Vaping Use: Never used  Substance and Sexual Activity  . Alcohol use: Yes    Comment: rarely  . Drug use: No  . Sexual activity: Never

## 2020-10-05 DIAGNOSIS — R7301 Impaired fasting glucose: Secondary | ICD-10-CM | POA: Diagnosis not present

## 2020-10-18 ENCOUNTER — Ambulatory Visit (INDEPENDENT_AMBULATORY_CARE_PROVIDER_SITE_OTHER): Payer: Medicare Other | Admitting: Orthopaedic Surgery

## 2020-10-18 ENCOUNTER — Ambulatory Visit (INDEPENDENT_AMBULATORY_CARE_PROVIDER_SITE_OTHER): Payer: Medicare Other

## 2020-10-18 DIAGNOSIS — S52531A Colles' fracture of right radius, initial encounter for closed fracture: Secondary | ICD-10-CM

## 2020-10-18 NOTE — Progress Notes (Signed)
   Office Visit Note   Patient: Deborah Skinner           Date of Birth: [birth year]           MRN: [id number] Visit Date: 10/18/2020              Requested by: Marva Panda, NP [address] [place],  [place] [address] PCP: Marva Panda, NP   Assessment & Plan: Visit Diagnoses:  1. Closed Colles' fracture of right radius, initial encounter     Plan: Impression is nearly healed right wrist Colles' fracture.  At this point, she may discontinue the use of her wrist splint.  We will start her on hand therapy to work on range of motion.  She will follow up with [place] in 4 weeks time for repeat evaluation and three-view x-rays of the right wrist.  Call with concerns or questions.  Follow-Up Instructions: Return in about 4 weeks (around 11/15/2020).   Orders:  Orders Placed This Encounter  Procedures  . XR Wrist 2 Views Right  . Ambulatory referral to Physical Therapy   No orders of the defined types were placed in this encounter.     Procedures: No procedures performed   Clinical Data: No additional findings.   Subjective: Chief Complaint  Patient presents with  . Right Wrist - Pain    HPI is a pleasant 87 year old female who comes in today with her son.  She is approximately 8 weeks out closed Colles' fracture right wrist.  She has been doing well.  She has been compliant wearing her removable splint.  No pain.  She does note slight paresthesias which occur primarily at night.     Objective: Vital Signs: There were no vitals taken for this visit.    Ortho Exam right wrist exam shows mild swelling.  No tenderness to the fracture site.  She does have fairly limited flexion and extension of the wrist.  She is neurovascular intact distally.  Specialty Comments:  No specialty comments available.  Imaging: XR Wrist 2 Views Right  Result Date: 10/18/2020 X-rays demonstrate considerable consolidation of the fracture site    PMFS History: Patient  Active Problem List   Diagnosis Date Noted  . MVP (mitral valve prolapse)   . Palpitations   . Right carotid bruit   . HLD (hyperlipidemia)    Past Medical History:  Diagnosis Date  . HLD (hyperlipidemia)   . Hyperlipidemia   . Hypertension   . MVP (mitral valve prolapse)   . Palpitations   . Right carotid bruit     No family history on file.  No past surgical history on file. Social History   Occupational History  . Not on file  Tobacco Use  . Smoking status: Never Smoker  . Smokeless tobacco: Never Used  Vaping Use  . Vaping Use: Never used  Substance and Sexual Activity  . Alcohol use: Yes    Comment: rarely  . Drug use: No  . Sexual activity: Never

## 2020-10-31 DIAGNOSIS — M6281 Muscle weakness (generalized): Secondary | ICD-10-CM | POA: Diagnosis not present

## 2020-10-31 DIAGNOSIS — M25631 Stiffness of right wrist, not elsewhere classified: Secondary | ICD-10-CM | POA: Diagnosis not present

## 2020-10-31 DIAGNOSIS — W19XXXS Unspecified fall, sequela: Secondary | ICD-10-CM | POA: Diagnosis not present

## 2020-10-31 DIAGNOSIS — S52531D Colles' fracture of right radius, subsequent encounter for closed fracture with routine healing: Secondary | ICD-10-CM | POA: Diagnosis not present

## 2020-10-31 DIAGNOSIS — M25531 Pain in right wrist: Secondary | ICD-10-CM | POA: Diagnosis not present

## 2020-11-02 DIAGNOSIS — W19XXXS Unspecified fall, sequela: Secondary | ICD-10-CM | POA: Diagnosis not present

## 2020-11-02 DIAGNOSIS — M6281 Muscle weakness (generalized): Secondary | ICD-10-CM | POA: Diagnosis not present

## 2020-11-02 DIAGNOSIS — M25631 Stiffness of right wrist, not elsewhere classified: Secondary | ICD-10-CM | POA: Diagnosis not present

## 2020-11-02 DIAGNOSIS — S52531D Colles' fracture of right radius, subsequent encounter for closed fracture with routine healing: Secondary | ICD-10-CM | POA: Diagnosis not present

## 2020-11-02 DIAGNOSIS — M25531 Pain in right wrist: Secondary | ICD-10-CM | POA: Diagnosis not present

## 2020-11-07 DIAGNOSIS — W19XXXS Unspecified fall, sequela: Secondary | ICD-10-CM | POA: Diagnosis not present

## 2020-11-07 DIAGNOSIS — H353222 Exudative age-related macular degeneration, left eye, with inactive choroidal neovascularization: Secondary | ICD-10-CM | POA: Diagnosis not present

## 2020-11-07 DIAGNOSIS — M6281 Muscle weakness (generalized): Secondary | ICD-10-CM | POA: Diagnosis not present

## 2020-11-07 DIAGNOSIS — H43813 Vitreous degeneration, bilateral: Secondary | ICD-10-CM | POA: Diagnosis not present

## 2020-11-07 DIAGNOSIS — H25813 Combined forms of age-related cataract, bilateral: Secondary | ICD-10-CM | POA: Diagnosis not present

## 2020-11-07 DIAGNOSIS — M25531 Pain in right wrist: Secondary | ICD-10-CM | POA: Diagnosis not present

## 2020-11-07 DIAGNOSIS — M25631 Stiffness of right wrist, not elsewhere classified: Secondary | ICD-10-CM | POA: Diagnosis not present

## 2020-11-07 DIAGNOSIS — H353112 Nonexudative age-related macular degeneration, right eye, intermediate dry stage: Secondary | ICD-10-CM | POA: Diagnosis not present

## 2020-11-07 DIAGNOSIS — S52531D Colles' fracture of right radius, subsequent encounter for closed fracture with routine healing: Secondary | ICD-10-CM | POA: Diagnosis not present

## 2020-11-09 DIAGNOSIS — S52531D Colles' fracture of right radius, subsequent encounter for closed fracture with routine healing: Secondary | ICD-10-CM | POA: Diagnosis not present

## 2020-11-09 DIAGNOSIS — M6281 Muscle weakness (generalized): Secondary | ICD-10-CM | POA: Diagnosis not present

## 2020-11-09 DIAGNOSIS — M25631 Stiffness of right wrist, not elsewhere classified: Secondary | ICD-10-CM | POA: Diagnosis not present

## 2020-11-09 DIAGNOSIS — W19XXXS Unspecified fall, sequela: Secondary | ICD-10-CM | POA: Diagnosis not present

## 2020-11-09 DIAGNOSIS — M25531 Pain in right wrist: Secondary | ICD-10-CM | POA: Diagnosis not present

## 2020-11-14 DIAGNOSIS — M6281 Muscle weakness (generalized): Secondary | ICD-10-CM | POA: Diagnosis not present

## 2020-11-14 DIAGNOSIS — M25531 Pain in right wrist: Secondary | ICD-10-CM | POA: Diagnosis not present

## 2020-11-14 DIAGNOSIS — W19XXXS Unspecified fall, sequela: Secondary | ICD-10-CM | POA: Diagnosis not present

## 2020-11-14 DIAGNOSIS — S52531D Colles' fracture of right radius, subsequent encounter for closed fracture with routine healing: Secondary | ICD-10-CM | POA: Diagnosis not present

## 2020-11-14 DIAGNOSIS — M25631 Stiffness of right wrist, not elsewhere classified: Secondary | ICD-10-CM | POA: Diagnosis not present

## 2020-11-16 DIAGNOSIS — H353222 Exudative age-related macular degeneration, left eye, with inactive choroidal neovascularization: Secondary | ICD-10-CM | POA: Diagnosis not present

## 2020-11-16 DIAGNOSIS — H43813 Vitreous degeneration, bilateral: Secondary | ICD-10-CM | POA: Diagnosis not present

## 2020-11-16 DIAGNOSIS — H3540 Unspecified peripheral retinal degeneration: Secondary | ICD-10-CM | POA: Diagnosis not present

## 2020-11-16 DIAGNOSIS — H353112 Nonexudative age-related macular degeneration, right eye, intermediate dry stage: Secondary | ICD-10-CM | POA: Diagnosis not present

## 2020-11-18 DIAGNOSIS — W19XXXS Unspecified fall, sequela: Secondary | ICD-10-CM | POA: Diagnosis not present

## 2020-11-18 DIAGNOSIS — M25531 Pain in right wrist: Secondary | ICD-10-CM | POA: Diagnosis not present

## 2020-11-18 DIAGNOSIS — M25631 Stiffness of right wrist, not elsewhere classified: Secondary | ICD-10-CM | POA: Diagnosis not present

## 2020-11-18 DIAGNOSIS — S52531D Colles' fracture of right radius, subsequent encounter for closed fracture with routine healing: Secondary | ICD-10-CM | POA: Diagnosis not present

## 2020-11-18 DIAGNOSIS — M6281 Muscle weakness (generalized): Secondary | ICD-10-CM | POA: Diagnosis not present

## 2020-11-21 DIAGNOSIS — M25531 Pain in right wrist: Secondary | ICD-10-CM | POA: Diagnosis not present

## 2020-11-21 DIAGNOSIS — M25631 Stiffness of right wrist, not elsewhere classified: Secondary | ICD-10-CM | POA: Diagnosis not present

## 2020-11-21 DIAGNOSIS — M6281 Muscle weakness (generalized): Secondary | ICD-10-CM | POA: Diagnosis not present

## 2020-11-21 DIAGNOSIS — W19XXXS Unspecified fall, sequela: Secondary | ICD-10-CM | POA: Diagnosis not present

## 2020-11-21 DIAGNOSIS — S52531D Colles' fracture of right radius, subsequent encounter for closed fracture with routine healing: Secondary | ICD-10-CM | POA: Diagnosis not present

## 2020-11-23 DIAGNOSIS — M25531 Pain in right wrist: Secondary | ICD-10-CM | POA: Diagnosis not present

## 2020-11-23 DIAGNOSIS — S52531D Colles' fracture of right radius, subsequent encounter for closed fracture with routine healing: Secondary | ICD-10-CM | POA: Diagnosis not present

## 2020-11-23 DIAGNOSIS — W19XXXS Unspecified fall, sequela: Secondary | ICD-10-CM | POA: Diagnosis not present

## 2020-11-23 DIAGNOSIS — M6281 Muscle weakness (generalized): Secondary | ICD-10-CM | POA: Diagnosis not present

## 2020-11-23 DIAGNOSIS — M25631 Stiffness of right wrist, not elsewhere classified: Secondary | ICD-10-CM | POA: Diagnosis not present

## 2020-11-25 ENCOUNTER — Other Ambulatory Visit: Payer: Self-pay

## 2020-11-25 ENCOUNTER — Ambulatory Visit (INDEPENDENT_AMBULATORY_CARE_PROVIDER_SITE_OTHER): Payer: Medicare Other | Admitting: Orthopaedic Surgery

## 2020-11-25 ENCOUNTER — Ambulatory Visit (INDEPENDENT_AMBULATORY_CARE_PROVIDER_SITE_OTHER): Payer: Medicare Other

## 2020-11-25 VITALS — BP 127/63 | HR 80

## 2020-11-25 DIAGNOSIS — S52531A Colles' fracture of right radius, initial encounter for closed fracture: Secondary | ICD-10-CM

## 2020-11-25 NOTE — Progress Notes (Signed)
Office Visit Note   Patient: Deborah Skinner           Date of Birth: [birth year]           MRN: [id number] Visit Date: 11/25/2020              Requested by: Marva Panda, NP [address] [place],  [place] [address] PCP: Marva Panda, NP   Assessment & Plan: Visit Diagnoses:  1. Closed Colles' fracture of right radius, initial encounter     Plan: Patient is back to normal activity.  She has increased carpal tunnel symptoms should call let us know and we can set her up for some electrical test and she can see Dr. Deno Etienne about possible carpal tunnel release.  She is happy with results of treatment follow-up as needed.  Follow-Up Instructions: Return if symptoms worsen or fail to improve.   Orders:  Orders Placed This Encounter  Procedures  . XR Wrist Complete Right   No orders of the defined types were placed in this encounter.     Procedures: No procedures performed   Clinical Data: No additional findings.   Subjective: Chief Complaint  Patient presents with  . Right Wrist - Follow-up    HPI 87 year old female here with her son who has been treated for right distal radius Colles' fracture with angulation that occurred in January.  She has been doing some therapy has minimal discomfort when she exercises it too much.  She has better extension and then volar flexion due to residual angulation.  She is back doing normal household activities.  Postoperatively she had increased problems with carpal tunnel complaints but she states this is settled down does not bother her at night.  Review of Systems reviewed unchanged.   Objective: Vital Signs: BP 127/63   Pulse 80   Physical Exam Constitutional:      Appearance: She is well-developed.  HENT:     Head: Normocephalic.     Right Ear: External ear normal.     Left Ear: External ear normal.  Eyes:     Pupils: Pupils are equal, round, and reactive to light.  Neck:     Thyroid: No thyromegaly.      Trachea: No tracheal deviation.  Cardiovascular:     Rate and Rhythm: Normal rate.  Pulmonary:     Effort: Pulmonary effort is normal.  Abdominal:     Palpations: Abdomen is soft.  Skin:    General: Skin is warm and dry.  Neurological:     Mental Status: She is alert and oriented to person, place, and time.  Psychiatric:        Behavior: Behavior normal.     Ortho Exam fracture right distal radius nontender.  There is radial shortening some prominence of the ulnar styloid.  Good grip.  Limited 40 degrees wrist flexion she has 70 of extension.  Full pronation supination.  Mild thenar atrophy. Specialty Comments:  No specialty comments available.  Imaging: No results found.   PMFS History: Patient Active Problem List   Diagnosis Date Noted  . MVP (mitral valve prolapse)   . Palpitations   . Right carotid bruit   . HLD (hyperlipidemia)    Past Medical History:  Diagnosis Date  . HLD (hyperlipidemia)   . Hyperlipidemia   . Hypertension   . MVP (mitral valve prolapse)   . Palpitations   . Right carotid bruit     No family history on file.  No past  surgical history on file. Social History   Occupational History  . Not on file  Tobacco Use  . Smoking status: Never Smoker  . Smokeless tobacco: Never Used  Vaping Use  . Vaping Use: Never used  Substance and Sexual Activity  . Alcohol use: Yes    Comment: rarely  . Drug use: No  . Sexual activity: Never

## 2021-01-05 ENCOUNTER — Ambulatory Visit: Payer: Medicare Other | Admitting: Orthopaedic Surgery

## 2021-01-10 ENCOUNTER — Ambulatory Visit (INDEPENDENT_AMBULATORY_CARE_PROVIDER_SITE_OTHER): Payer: Medicare Other

## 2021-01-10 ENCOUNTER — Ambulatory Visit (INDEPENDENT_AMBULATORY_CARE_PROVIDER_SITE_OTHER): Payer: Medicare Other | Admitting: Orthopaedic Surgery

## 2021-01-10 DIAGNOSIS — M25531 Pain in right wrist: Secondary | ICD-10-CM

## 2021-01-10 NOTE — Progress Notes (Signed)
   Office Visit Note   Patient: Deborah Skinner           Date of Birth: [birth year]           MRN: [id number] Visit Date: 01/10/2021              Requested by: Marva Panda, NP [address] [place],  [place] [address] PCP: Marva Panda, NP   Assessment & Plan: Visit Diagnoses:  1. 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  We have discussed the use of topical anti-inflammatories in addition to ordering nerve conduction studies to the right upper extremity.  She does not believe her carpal tunnel symptoms are significant enough to proceed with nerve conduction study.  She will pick up Voltaren gel over-the-counter.  She will follow-up with [place] as needed.  Follow-Up Instructions: Return if symptoms worsen or fail to improve.   Orders:  Orders Placed This Encounter  Procedures  . XR Wrist 2 Views Right   No orders of the defined types were placed in this encounter.     Procedures: No procedures performed   Clinical Data: No additional findings.   Subjective: Chief Complaint  Patient presents with  . Right Wrist - Pain    HPI patient is a pleasant 87 year old female who comes in today 5 months out right distal radius fracture.  She still notes some discomfort with increased activity such as gardening.  She notes some tingling to the hand and forearm as well.  Overall, her symptoms have improved.     Objective: Vital Signs: There were no vitals taken for this visit.    Ortho Exam right wrist exam shows volar deformity which is unchanged from the time of fracture.  No swelling.  No tenderness.  Limited flexion.  She is neurovascular intact distally.  Specialty Comments:  No specialty comments available.  Imaging: XR Wrist 2 Views Right  Result Date: 01/10/2021 X-rays demonstrate consolidation to distal radius fracture.    PMFS  History: Patient Active Problem List   Diagnosis Date Noted  . MVP (mitral valve prolapse)   . Palpitations   . Right carotid bruit   . HLD (hyperlipidemia)    Past Medical History:  Diagnosis Date  . HLD (hyperlipidemia)   . Hyperlipidemia   . Hypertension   . MVP (mitral valve prolapse)   . Palpitations   . Right carotid bruit     No family history on file.  No past surgical history on file. Social History   Occupational History  . Not on file  Tobacco Use  . Smoking status: Never Smoker  . Smokeless tobacco: Never Used  Vaping Use  . Vaping Use: Never used  Substance and Sexual Activity  . Alcohol use: Yes    Comment: rarely  . Drug use: No  . Sexual activity: Never

## 2021-01-30 ENCOUNTER — Other Ambulatory Visit: Payer: Self-pay

## 2021-01-30 ENCOUNTER — Encounter: Payer: Self-pay | Admitting: Cardiology

## 2021-01-30 ENCOUNTER — Ambulatory Visit: Payer: Medicare Other | Admitting: Cardiology

## 2021-01-30 VITALS — BP 139/78 | HR 78 | Temp 96.4°F | Resp 16 | Ht 62.0 in | Wt 96.6 lb

## 2021-01-30 DIAGNOSIS — I10 Essential (primary) hypertension: Secondary | ICD-10-CM

## 2021-01-30 DIAGNOSIS — I340 Nonrheumatic mitral (valve) insufficiency: Secondary | ICD-10-CM

## 2021-01-30 DIAGNOSIS — R0609 Other forms of dyspnea: Secondary | ICD-10-CM

## 2021-01-30 DIAGNOSIS — R0600 Dyspnea, unspecified: Secondary | ICD-10-CM

## 2021-01-30 DIAGNOSIS — I341 Nonrheumatic mitral (valve) prolapse: Secondary | ICD-10-CM | POA: Diagnosis not present

## 2021-01-30 NOTE — Patient Instructions (Signed)
Thank you for the visit today.  You have mitral valve regurgitation which is remained stable.  I am going to recheck an echocardiogram and I would like to see back in a year.  MR= Mitral regurgitation. (leakage on the left heart valve)  Similarly TR means tricuspid regurgitation (leakage on the right heart valve). Mild MR or TR are probably normal variants unless physicians feel it is abnormal. Reassure.

## 2021-01-30 NOTE — Progress Notes (Signed)
Primary Physician/Referring:  Deborah Panda, NP  Patient ID: Deborah Skinner, female    DOB: 1933/07/27, 87 y.o.   MRN: [id number]  Chief Complaint  Patient presents with   Mitral Regurgitation   Hypertension   Follow-up    1 year    Dizziness   HPI:    Deborah Skinner  is a 87 y.o. emale  with hypertension, mild hyperlipidemia, palpitations, moderate MR due to MVP presents here for annual visit.  She has chronic mild exertional dyspnea and states that this may have gradually been getting worse but she denies any PND or orthopnea.  States that overall she is not very concerned about the dyspnea.  She is on metoprolol and lisinopril HCT for palpitations and MVP which she is tolerating. Occasional palpitations and occasional dizziness.    Past Medical History:  Diagnosis Date   HLD (hyperlipidemia)    Hyperlipidemia    Hypertension    MVP (mitral valve prolapse)    Palpitations    Right carotid bruit    History reviewed. No pertinent surgical history. Family History  Problem Relation Age of Onset   Heart failure Brother    COPD Brother     Social History   Tobacco Use   Smoking status: Never   Smokeless tobacco: Never  Substance Use Topics   Alcohol use: Yes    Comment: rarely   Marital Status: Widowed  ROS  Review of Systems  Cardiovascular:  Positive for dyspnea on exertion and palpitations (occasional). Negative for chest pain and leg swelling.  Gastrointestinal:  Negative for melena.  Neurological:  Positive for loss of balance.  Objective  Blood pressure 139/78, pulse 78, temperature (!) 96.4 F (35.8 C), temperature source Temporal, resp. rate 16, height 5\' 2"  (1.575 m), weight 96 lb 9.6 oz (43.8 kg), SpO2 96 %.  Vitals with BMI 01/30/2021 11/25/2020 09/20/2020  Height 5\' 2"  - 5\' 2"   Weight 96 lbs 10 oz - 103 lbs  BMI 17.66 - 18.83  Systolic 139 127 -  Diastolic 78 63 -  Pulse 78 80 -      Physical Exam Constitutional:      General: She is not  in acute distress.    Comments: Petite  Neck:     Vascular: No carotid bruit or JVD.  Cardiovascular:     Rate and Rhythm: Normal rate and regular rhythm.     Pulses: Normal pulses and intact distal pulses.     Heart sounds: A midsystolic click. Murmur heard.  Crescendo-decrescendo mid to late systolic murmur is present with a grade of 3/6 at the apex.    No gallop.     Comments: No leg edema, no JVD.  Pulmonary:     Effort: Pulmonary effort is normal. No accessory muscle usage or respiratory distress.     Breath sounds: Normal breath sounds.  Abdominal:     General: Bowel sounds are normal.     Palpations: Abdomen is soft.   Laboratory examination:   External labs:   Lipid Panel completed 09/09/2019 Cholesterol, total 232.000 09/09/2019 Triglycerides 189.000 09/09/2019 HDL 79.000 09/09/2019 LDL-C 11/07/2019 09/09/2019  Glucose Random 98.000 09/09/2019  BUN 15.000 09/09/2019 Creatinine, Serum 0.780 09/09/2019  TSH 1.790 08/27/2017  Medications and allergies   Allergies  Allergen Reactions   Niacin     Other reaction(s): large doses - tingling/burning   Vitamin D     Other reaction(s): 50,000 IU causes rash     Current Outpatient Medications  Medication Instructions   amLODipine (NORVASC) 5 mg, Oral, Daily   atorvastatin (LIPITOR) 10 mg, Oral, Daily   lisinopril-hydrochlorothiazide (ZESTORETIC) 10-12.5 MG tablet 1 tablet, Oral, Daily   metoprolol tartrate (LOPRESSOR) 25 mg, Oral, 2 times daily   Multiple Vitamin tablet 1 tablet, Oral, Daily   ranitidine (ZANTAC) 150 mg, Oral, As needed   Radiology:   No results found.  Cardiac Studies:   Exercise myoview stress test 04/01/2017: 1. The resting electrocardiogram demonstrated normal sinus rhythm and nonspecific ST-T changes with uplsoping ST depression in inferior and lateral leads, no resting arrhythmias. The stress electrocardiogram was normal.  Stress symptoms included dyspnea. Patient exercised on modified Bruce protocol for  5:00 minutes and achieved 3.7 METS. Stress test terminated due to fatigue, dyspnea and 119% MPHR achieved (Target HR >85%). Hypertensive at rest and stress with peak BP 186/90 mm Hg. 2. Myocardial perfusion imaging is normal. Overall left ventricular systolic function was normal without regional wall motion abnormalities. The left ventricular ejection fraction was 72%.  Event Monitor 30 days (4/4 to 12/06/17) Unscheduled transmission 11/07/2017: 4 beat NSVT and one V-Couplet at 12:56 AM. No symptoms reported. Manually detected events include occasional PVC (7 %), rare PAC.  Carotid artery duplex 12/03/2017: No hemodynamically significant arterial disease in the internal carotid artery bilaterally. No significant plaque noted. Antegrade right vertebral artery flow. Antegrade left vertebral artery flow.  Echocardiogram 01/19/2020:  Left ventricle cavity is normal in size and wall thickness. Normal global wall motion. Normal LV systolic function with EF 62%. Doppler evidence of  grade I (impaired) diastolic dysfunction, normal LAP.  Mild bileaflet prolapse with moderate (Grade II) mitral regurgitation.  Moderate tricuspid regurgitation.  No evidence of pulmonary hypertension.  No significant change compared to previous study on 01/09/2018.  EKG  EKG 01/30/2021: Normal sinus rhythm at rate of 68 bpm, normal axis, incomplete right bundle branch block.  Normal EKG.  No change from previous.  Assessment     ICD-10-CM   1. Moderate mitral regurgitation  I34.0 PCV ECHOCARDIOGRAM COMPLETE    2. MVP (mitral valve prolapse)  I34.1 PCV ECHOCARDIOGRAM COMPLETE    3. Essential hypertension  I10 EKG 12-Lead    4. Dyspnea on exertion  R06.00        No orders of the defined types were placed in this encounter.   Medications Discontinued During This Encounter  Medication Reason   HYDROcodone-acetaminophen (NORCO) 5-325 MG tablet Error   Cholecalciferol (VITAMIN D) 50 MCG (2000 UT) tablet Error    ondansetron (ZOFRAN) 4 MG tablet Error    Recommendations:   Deborah Skinner  is a 87 y.o. female  with hypertension, mild hyperlipidemia, palpitations, moderate MR due to MVP presents here for annual visit.  She has chronic mild exertional dyspnea and states that this may have gradually been getting worse but she denies any PND or orthopnea.  States that overall she is not very concerned about the dyspnea.  No significant change in physical exam.  I would like to repeat echocardiogram to reevaluate her mitral regurgitation in view of dyspnea.  Blood pressure is well controlled.  No clinical evidence of heart failure, otherwise stable from cardiac standpoint, I will see her back on annual basis.  Continue observation for now.  No endocarditis prophylaxis needed.  She is aware to contact me immediately if she notices dyspnea out of proportion to her present state.   Yates Decamp, MD, [facility] 01/30/2021, 10:29 AM Office: [phone]

## 2021-02-08 ENCOUNTER — Other Ambulatory Visit: Payer: Self-pay | Admitting: Cardiology

## 2021-02-08 DIAGNOSIS — I10 Essential (primary) hypertension: Secondary | ICD-10-CM

## 2021-02-09 ENCOUNTER — Ambulatory Visit: Payer: Medicare Other

## 2021-02-09 ENCOUNTER — Other Ambulatory Visit: Payer: Self-pay

## 2021-02-09 DIAGNOSIS — I341 Nonrheumatic mitral (valve) prolapse: Secondary | ICD-10-CM

## 2021-02-09 DIAGNOSIS — I340 Nonrheumatic mitral (valve) insufficiency: Secondary | ICD-10-CM | POA: Diagnosis not present

## 2021-02-15 DIAGNOSIS — H353112 Nonexudative age-related macular degeneration, right eye, intermediate dry stage: Secondary | ICD-10-CM | POA: Diagnosis not present

## 2021-02-16 NOTE — Progress Notes (Signed)
Called and spoke with patient regarding her echocardiogram results.

## 2021-02-16 NOTE — Progress Notes (Signed)
Echocardiogram 02/09/2021: Normal LV systolic function with visual EF 60-65%. Left ventricle cavity is normal in size. Normal global wall motion. Normal diastolic filling pattern, normal LAP. Mild (Grade I) aortic regurgitation. Mild mitral valve leaflet thickening. Bileaflet mitral valve prolapse. Mild (Grade I) mitral regurgitation. Mild tricuspid regurgitation. No evidence of pulmonary hypertension. Compared to study dated 01/19/2020: MR and TR was moderate and now mild otherwise no significant change.  Let patient know the mitral valve prolapse is stable and leak is only mild, stable from previous

## 2021-04-20 ENCOUNTER — Other Ambulatory Visit: Payer: Self-pay | Admitting: Cardiology

## 2021-05-17 DIAGNOSIS — H35371 Puckering of macula, right eye: Secondary | ICD-10-CM | POA: Diagnosis not present

## 2021-05-17 DIAGNOSIS — H353222 Exudative age-related macular degeneration, left eye, with inactive choroidal neovascularization: Secondary | ICD-10-CM | POA: Diagnosis not present

## 2021-05-17 DIAGNOSIS — H353112 Nonexudative age-related macular degeneration, right eye, intermediate dry stage: Secondary | ICD-10-CM | POA: Diagnosis not present

## 2021-05-17 DIAGNOSIS — H43813 Vitreous degeneration, bilateral: Secondary | ICD-10-CM | POA: Diagnosis not present

## 2021-09-27 DIAGNOSIS — I10 Essential (primary) hypertension: Secondary | ICD-10-CM | POA: Diagnosis not present

## 2021-09-27 DIAGNOSIS — M810 Age-related osteoporosis without current pathological fracture: Secondary | ICD-10-CM | POA: Diagnosis not present

## 2021-09-27 DIAGNOSIS — H8110 Benign paroxysmal vertigo, unspecified ear: Secondary | ICD-10-CM | POA: Diagnosis not present

## 2021-09-27 DIAGNOSIS — Z0000 Encounter for general adult medical examination without abnormal findings: Secondary | ICD-10-CM | POA: Diagnosis not present

## 2021-09-27 DIAGNOSIS — E7800 Pure hypercholesterolemia, unspecified: Secondary | ICD-10-CM | POA: Diagnosis not present

## 2021-09-27 DIAGNOSIS — I493 Ventricular premature depolarization: Secondary | ICD-10-CM | POA: Diagnosis not present

## 2021-09-27 DIAGNOSIS — K219 Gastro-esophageal reflux disease without esophagitis: Secondary | ICD-10-CM | POA: Diagnosis not present

## 2021-09-27 DIAGNOSIS — E46 Unspecified protein-calorie malnutrition: Secondary | ICD-10-CM | POA: Diagnosis not present

## 2021-09-27 DIAGNOSIS — R5381 Other malaise: Secondary | ICD-10-CM | POA: Diagnosis not present

## 2021-11-08 ENCOUNTER — Other Ambulatory Visit: Payer: Self-pay | Admitting: Cardiology

## 2021-11-08 DIAGNOSIS — H40021 Open angle with borderline findings, high risk, right eye: Secondary | ICD-10-CM | POA: Diagnosis not present

## 2021-11-08 DIAGNOSIS — H25813 Combined forms of age-related cataract, bilateral: Secondary | ICD-10-CM | POA: Diagnosis not present

## 2021-11-08 DIAGNOSIS — H353112 Nonexudative age-related macular degeneration, right eye, intermediate dry stage: Secondary | ICD-10-CM | POA: Diagnosis not present

## 2021-11-08 DIAGNOSIS — H353222 Exudative age-related macular degeneration, left eye, with inactive choroidal neovascularization: Secondary | ICD-10-CM | POA: Diagnosis not present

## 2022-01-31 ENCOUNTER — Encounter: Payer: Self-pay | Admitting: Cardiology

## 2022-01-31 ENCOUNTER — Ambulatory Visit: Payer: Medicare Other | Admitting: Cardiology

## 2022-01-31 VITALS — BP 126/66 | HR 88 | Temp 98.0°F | Resp 17 | Ht 62.0 in | Wt 97.0 lb

## 2022-01-31 DIAGNOSIS — I10 Essential (primary) hypertension: Secondary | ICD-10-CM | POA: Diagnosis not present

## 2022-01-31 DIAGNOSIS — I340 Nonrheumatic mitral (valve) insufficiency: Secondary | ICD-10-CM

## 2022-01-31 DIAGNOSIS — I341 Nonrheumatic mitral (valve) prolapse: Secondary | ICD-10-CM

## 2022-01-31 NOTE — Progress Notes (Signed)
Primary Physician/Referring:  Marva Panda, NP  Patient ID: Deborah Skinner, female    DOB: 1933-04-04, 88 y.o.   MRN: [id number]  Chief Complaint  Patient presents with   Mitral Regurgitation   Follow-up    1 year   HPI:    BROOKS STOTZ  is a 88 y.o. female  with hypertension, mild hyperlipidemia, palpitations, moderate MR due to MVP. She has chronic mild exertional dyspnea.   Patient was last seen in the office 01/30/2021 and now presents here for annual follow-up.  Following last office visit patient underwent repeat echocardiogram which is stable compared to previous with mitral valve prolapse and only mild mitral regurgitation.   Patient remains active working in her garden.  She states mild exertional dyspnea is stable.  She has occasional brief episodes of dizziness upon standing, lasting several seconds.  Denies syncope, near syncope.  Denies chest pain, palpitations.  Past Medical History:  Diagnosis Date   HLD (hyperlipidemia)    Hyperlipidemia    Hypertension    MVP (mitral valve prolapse)    Palpitations    Right carotid bruit    History reviewed. No pertinent surgical history. Family History  Problem Relation Age of Onset   Heart failure Brother    COPD Brother     Social History   Tobacco Use   Smoking status: Never   Smokeless tobacco: Never  Substance Use Topics   Alcohol use: Yes    Comment: rarely   Marital Status: Widowed  ROS  Review of Systems  Cardiovascular:  Positive for dyspnea on exertion (stable). Negative for chest pain, leg swelling, near-syncope, orthopnea, palpitations (occasional) and syncope.   Objective  Blood pressure 126/66, pulse 88, temperature 98 F (36.7 C), temperature source Temporal, resp. rate 17, height 5\' 2"  (1.575 m), weight 97 lb (44 kg), SpO2 97 %.     01/31/2022    9:56 AM 01/31/2022    9:54 AM 01/30/2021    9:24 AM  Vitals with BMI  Height  5\' 2"  5\' 2"   Weight  97 lbs 96 lbs 10 oz  BMI  17.74 17.66   Systolic 126 145 02/01/2021  Diastolic 66 74 78  Pulse 88 77 78      Physical Exam Constitutional:      General: She is not in acute distress.    Comments: Petite  Neck:     Vascular: No carotid bruit or JVD.  Cardiovascular:     Rate and Rhythm: Normal rate and regular rhythm.     Pulses: Normal pulses and intact distal pulses.     Heart sounds: A midsystolic click. Murmur heard.     Crescendo-decrescendo mid to late systolic murmur is present with a grade of 3/6 at the apex.     No gallop.     Comments: No JVD Pulmonary:     Effort: Pulmonary effort is normal. No accessory muscle usage or respiratory distress.     Breath sounds: Normal breath sounds.  Musculoskeletal:     Right lower leg: No edema.     Left lower leg: No edema.    Laboratory examination:       No data to display             No data to display         Lipid Panel  No results found for: "CHOL", "TRIG", "HDL", "CHOLHDL", "VLDL", "LDLCALC", "LDLDIRECT" HEMOGLOBIN A1C No results found for: "HGBA1C", "MPG" TSH No results for input(s): "  TSH" in the last 8760 hours.  External labs:  09/27/2021: Hgb 14.4, HCT 41.9, MCV 89.4, platelet 245 Creatinine 0.79, GFR >60, sodium 134, potassium 4.6, BUN 15 TSH 2.0 LDL 119, HDL 95, cholesterol 231, triglycerides 105 A1c 5.8%  Allergies   Allergies  Allergen Reactions   Niacin     Other reaction(s): large doses - tingling/burning   Vitamin D     Other reaction(s): 50,000 IU causes rash    Medications Prior to Visit:   Outpatient Medications Prior to Visit  Medication Sig Dispense Refill   amLODipine (NORVASC) 5 MG tablet TAKE 1 TABLET(5 MG) BY MOUTH DAILY 90 tablet 3   atorvastatin (LIPITOR) 10 MG tablet Take 10 mg by mouth daily.     lisinopril-hydrochlorothiazide (ZESTORETIC) 10-12.5 MG tablet Take 1 tablet by mouth daily.     metoprolol tartrate (LOPRESSOR) 25 MG tablet TAKE 1 TABLET(25 MG) BY MOUTH TWICE DAILY 180 tablet 1   Multiple Vitamin tablet  Take 1 tablet by mouth daily.     ranitidine (ZANTAC) 150 MG capsule Take 150 mg by mouth as needed for heartburn.     No facility-administered medications prior to visit.   Final Medications at End of Visit    Current Meds  Medication Sig   amLODipine (NORVASC) 5 MG tablet TAKE 1 TABLET(5 MG) BY MOUTH DAILY   atorvastatin (LIPITOR) 10 MG tablet Take 10 mg by mouth daily.   lisinopril-hydrochlorothiazide (ZESTORETIC) 10-12.5 MG tablet Take 1 tablet by mouth daily.   metoprolol tartrate (LOPRESSOR) 25 MG tablet TAKE 1 TABLET(25 MG) BY MOUTH TWICE DAILY   Multiple Vitamin tablet Take 1 tablet by mouth daily.   ranitidine (ZANTAC) 150 MG capsule Take 150 mg by mouth as needed for heartburn.   Radiology:   No results found.  Cardiac Studies:   Exercise myoview stress test 04/01/2017: 1. The resting electrocardiogram demonstrated normal sinus rhythm and nonspecific ST-T changes with uplsoping ST depression in inferior and lateral leads, no resting arrhythmias. The stress electrocardiogram was normal.  Stress symptoms included dyspnea. Patient exercised on modified Bruce protocol for 5:00 minutes and achieved 3.7 METS. Stress test terminated due to fatigue, dyspnea and 119% MPHR achieved (Target HR >85%). Hypertensive at rest and stress with peak BP 186/90 mm Hg. 2. Myocardial perfusion imaging is normal. Overall left ventricular systolic function was normal without regional wall motion abnormalities. The left ventricular ejection fraction was 72%.  Event Monitor 30 days (4/4 to 12/06/17) Unscheduled transmission 11/07/2017: 4 beat NSVT and one V-Couplet at 12:56 AM. No symptoms reported. Manually detected events include occasional PVC (7 %), rare PAC.  Carotid artery duplex 12/03/2017: No hemodynamically significant arterial disease in the internal carotid artery bilaterally. No significant plaque noted. Antegrade right vertebral artery flow. Antegrade left vertebral artery  flow.  Echocardiogram 02/09/2021: Normal LV systolic function with visual EF 60-65%. Left ventricle cavity is normal in size. Normal global wall motion. Normal diastolic filling pattern, normal LAP. Mild (Grade I) aortic regurgitation. Mild mitral valve leaflet thickening. Bileaflet mitral valve prolapse. Mild (Grade I) mitral regurgitation. Mild tricuspid regurgitation. No evidence of pulmonary hypertension. Compared to study dated 01/19/2020: MR and TR was moderate and now mild otherwise no significant change.  EKG  01/31/2022: Normal sinus rhythm at a rate of 80 bpm.  Borderline left atrial enlargement.  Normal axis.  Incomplete right bundle branch block.  IVCD.  Non-specific T wave abnormality.  EKG 01/30/2021: Normal sinus rhythm at rate of 68 bpm, normal axis, incomplete right  bundle branch block.  Normal EKG.  No change from previous.  Assessment     ICD-10-CM   1. Moderate mitral regurgitation  I34.0 EKG 12-Lead    2. MVP (mitral valve prolapse)  I34.1     3. Essential hypertension  I10        No orders of the defined types were placed in this encounter.   There are no discontinued medications.   Recommendations:   LENOX LADOUCEUR  is a 88 y.o. female  with hypertension, mild hyperlipidemia, palpitations, moderate MR due to MVP. She has chronic mild exertional dyspnea.   Patient was last seen in the office 01/30/2021 and now presents here for annual follow-up.  Following last office visit patient underwent repeat echocardiogram which is stable compared to previous with mitral valve prolapse and only mild mitral regurgitation.  Dyspnea remains stable.  There is no clinical evidence of heart failure.  No change in physical exam.  Blood pressure is well controlled.  No need for endocarditis prophylaxis.  Reviewed and discussed results of echocardiogram.  Follow-up in 1 year, sooner if needed.   Rayford Halsted, PA-C 01/31/2022, 10:45 AM Office: [phone]

## 2022-03-14 DIAGNOSIS — H353112 Nonexudative age-related macular degeneration, right eye, intermediate dry stage: Secondary | ICD-10-CM | POA: Diagnosis not present

## 2022-03-14 DIAGNOSIS — H353222 Exudative age-related macular degeneration, left eye, with inactive choroidal neovascularization: Secondary | ICD-10-CM | POA: Diagnosis not present

## 2022-03-14 DIAGNOSIS — H43813 Vitreous degeneration, bilateral: Secondary | ICD-10-CM | POA: Diagnosis not present

## 2022-03-14 DIAGNOSIS — H35371 Puckering of macula, right eye: Secondary | ICD-10-CM | POA: Diagnosis not present

## 2022-05-09 ENCOUNTER — Other Ambulatory Visit: Payer: Self-pay | Admitting: Cardiology

## 2022-05-09 DIAGNOSIS — I10 Essential (primary) hypertension: Secondary | ICD-10-CM

## 2022-11-16 DIAGNOSIS — Z0000 Encounter for general adult medical examination without abnormal findings: Secondary | ICD-10-CM | POA: Diagnosis not present

## 2022-11-16 DIAGNOSIS — I10 Essential (primary) hypertension: Secondary | ICD-10-CM | POA: Diagnosis not present

## 2022-11-16 DIAGNOSIS — E7800 Pure hypercholesterolemia, unspecified: Secondary | ICD-10-CM | POA: Diagnosis not present

## 2022-11-16 DIAGNOSIS — R413 Other amnesia: Secondary | ICD-10-CM | POA: Diagnosis not present

## 2022-11-16 DIAGNOSIS — E559 Vitamin D deficiency, unspecified: Secondary | ICD-10-CM | POA: Diagnosis not present

## 2023-02-01 ENCOUNTER — Ambulatory Visit: Payer: PRIVATE HEALTH INSURANCE | Admitting: Cardiology

## 2023-02-04 ENCOUNTER — Encounter: Payer: Self-pay | Admitting: Cardiology

## 2023-02-04 ENCOUNTER — Ambulatory Visit: Payer: Medicare Other | Admitting: Cardiology

## 2023-02-04 VITALS — BP 138/79 | HR 85 | Resp 16 | Ht 62.0 in | Wt 96.0 lb

## 2023-02-04 DIAGNOSIS — I341 Nonrheumatic mitral (valve) prolapse: Secondary | ICD-10-CM | POA: Diagnosis not present

## 2023-02-04 DIAGNOSIS — E7800 Pure hypercholesterolemia, unspecified: Secondary | ICD-10-CM | POA: Diagnosis not present

## 2023-02-04 DIAGNOSIS — I10 Essential (primary) hypertension: Secondary | ICD-10-CM

## 2023-02-04 NOTE — Progress Notes (Signed)
Primary Physician/Referring:  Jackelyn Poling, DO  Patient ID: Deborah Skinner, female    DOB: 11-28-1932, 89 y.o.   MRN: [id number]  Chief Complaint  Patient presents with   Moderate mitral regurgitation   Essential hypertension   Follow-up    1 year   HPI:    Deborah Skinner  is a 89 y.o. female  with hypertension, mild hyperlipidemia, palpitations, moderate MR due to MVP.  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  Patient remains active working in her garden.  States that she has given up on watering the plants as they have been scratching in this heat weather, states that "if they cannot fend for themselves did not feel to be here".  Denies chest pain, palpitations.  Past Medical History:  Diagnosis Date   HLD (hyperlipidemia)    Hyperlipidemia    Hypertension    MVP (mitral valve prolapse)    Palpitations    Right carotid bruit    History reviewed. No pertinent surgical history. Family History  Problem Relation Age of Onset   Heart failure Brother    COPD Brother     Social History   Tobacco Use   Smoking status: Never   Smokeless tobacco: Never  Substance Use Topics   Alcohol use: Yes    Comment: rarely   Marital Status: Widowed  ROS  ROS Objective  Blood pressure 138/79, pulse 85, resp. rate 16, height 5\' 2"  (1.575 m), weight 96 lb (43.5 kg), SpO2 98 %.     02/04/2023   11:45 AM 01/31/2022    9:56 AM 01/31/2022    9:54 AM  Vitals with BMI  Height 5\' 2"   5\' 2"   Weight 96 lbs  97 lbs  BMI 17.55  17.74  Systolic 138 126 829  Diastolic 79 66 74  Pulse 85 88 77      Physical Exam Constitutional:      Comments: Petite  Neck:     Vascular: No carotid bruit or JVD.  Cardiovascular:     Rate and Rhythm: Normal rate and regular rhythm.     Pulses: Normal pulses and intact distal pulses.     Heart sounds: A midsystolic click. Murmur heard.     Crescendo-decrescendo mid to late systolic murmur is present with a  grade of 3/6 at the apex.     No gallop.  Pulmonary:     Effort: Pulmonary effort is normal. No accessory muscle usage or respiratory distress.     Breath sounds: Normal breath sounds.  Abdominal:     General: Bowel sounds are normal.     Palpations: Abdomen is soft.  Musculoskeletal:     Right lower leg: No edema.     Left lower leg: No edema.    Laboratory examination:   External labs:   Labs 11/24/2022:  Serum glucose 98 mg, BUN 11, creatinine 0.73, EGFR 79 mL, potassium 4.7.  LFTs normal.  Total cholesterol 233, triglycerides 100, HDL 92, LDL 123.  Non-HDL cholesterol 140.  09/27/2021: Hgb 14.4, HCT 41.9, MCV 89.4, platelet 245 Creatinine 0.79, GFR >60, sodium 134, potassium 4.6, BUN 15 TSH 2.0 LDL 119, HDL 95, cholesterol 231, triglycerides 105 A1c 5.8%  Radiology:   No results found.  Cardiac Studies:   Exercise myoview stress test 04/01/2017: 1. The resting electrocardiogram demonstrated normal sinus rhythm and nonspecific ST-T changes with uplsoping ST depression in inferior and lateral leads, no resting arrhythmias. The stress  electrocardiogram was normal.  Stress symptoms included dyspnea. Patient exercised on modified Bruce protocol for 5:00 minutes and achieved 3.7 METS. Stress test terminated due to fatigue, dyspnea and 119% MPHR achieved (Target HR >85%). Hypertensive at rest and stress with peak BP 186/90 mm Hg. 2. Myocardial perfusion imaging is normal. Overall left ventricular systolic function was normal without regional wall motion abnormalities. The left ventricular ejection fraction was 72%.  Event Monitor 30 days (4/4 to 12/06/17) Unscheduled transmission 11/07/2017: 4 beat NSVT and one V-Couplet at 12:56 AM. No symptoms reported. Manually detected events include occasional PVC (7 %), rare PAC.  Carotid artery duplex 12/03/2017: No hemodynamically significant arterial disease in the internal carotid artery bilaterally. No significant plaque  noted. Antegrade right vertebral artery flow. Antegrade left vertebral artery flow.  Echocardiogram 02/09/2021: Normal LV systolic function with visual EF 60-65%. Left ventricle cavity is normal in size. Normal global wall motion. Normal diastolic filling pattern, normal LAP. Mild (Grade I) aortic regurgitation. Mild mitral valve leaflet thickening. Bileaflet mitral valve prolapse. Mild (Grade I) mitral regurgitation. Mild tricuspid regurgitation. No evidence of pulmonary hypertension. Compared to study dated 01/19/2020: MR and TR was moderate and now mild otherwise no significant change.  EKG   EKG 02/04/2023: Normal sinus rhythm at rate of 87 bpm, left atrial enlargement, left axis deviation, left anterior fascicular block.  Incomplete right bundle branch block.  Poor progression, probably normal variant.  Nonspecific lateral ST segment changes.  Normal QT interval.  No significant change from 01/31/2022.  Allergies   Allergies  Allergen Reactions   Niacin     Other reaction(s): large doses - tingling/burning   Vitamin D (Calciferol)     Other reaction(s): 50,000 IU causes rash    Current Outpatient Medications:    amLODipine (NORVASC) 5 MG tablet, TAKE 1 TABLET(5 MG) BY MOUTH DAILY, Disp: 90 tablet, Rfl: 3   atorvastatin (LIPITOR) 10 MG tablet, Take 10 mg by mouth daily., Disp: , Rfl:    lisinopril-hydrochlorothiazide (ZESTORETIC) 10-12.5 MG tablet, Take 1 tablet by mouth daily., Disp: , Rfl:    metoprolol tartrate (LOPRESSOR) 25 MG tablet, TAKE 1 TABLET(25 MG) BY MOUTH TWICE DAILY, Disp: 180 tablet, Rfl: 1   Multiple Vitamin tablet, Take 1 tablet by mouth daily., Disp: , Rfl:    ranitidine (ZANTAC) 150 MG capsule, Take 150 mg by mouth as needed for heartburn., Disp: , Rfl:    Assessment     ICD-10-CM   1. MVP (mitral valve prolapse)  I34.1 EKG 12-Lead    2. Essential hypertension  I10     3. Hypercholesteremia  E78.00       No orders of the defined types were placed in this  encounter.   There are no discontinued medications.   Recommendations:   Deborah Skinner  is a 89 y.o. female  with hypertension, mild hyperlipidemia, palpitations, moderate MR due to MVP.  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  1. MVP (mitral valve prolapse) Patient presents for annual visit, physical examination as indeed revealed very prominent mitral valve prolapse murmur and mitral valve click.  I will repeat echocardiogram as it has been 2 years since last echocardiogram.  No clinical evidence of heart failure. - EKG 12-Lead - PCV ECHOCARDIOGRAM COMPLETE; Future  2. Essential hypertension Blood pressure is well-controlled.  Did not make any med changes today.  External labs reviewed, renal function is normal. - PCV ECHOCARDIOGRAM COMPLETE; Future  3. Hypercholesteremia LDL all  elevated, she also has elevated HDL and non-HDL cholesterol is about 10 points less than goal at 130.  However she has no known vascular disease, she is presently 89 years of age, hence continue high intensity low-dose statin therapy.  Unless echocardiogram reveals significant abnormality I will see her back on annual basis.    Yates Decamp, MD, [facility] 02/04/2023, 12:27 PM Office: [phone] Fax: [phone] Pager: [phone]

## 2023-03-07 ENCOUNTER — Ambulatory Visit: Payer: Medicare Other

## 2023-03-07 DIAGNOSIS — I10 Essential (primary) hypertension: Secondary | ICD-10-CM

## 2023-03-07 DIAGNOSIS — I341 Nonrheumatic mitral (valve) prolapse: Secondary | ICD-10-CM

## 2023-03-07 DIAGNOSIS — I340 Nonrheumatic mitral (valve) insufficiency: Secondary | ICD-10-CM

## 2023-03-12 NOTE — Progress Notes (Signed)
Echocardiogram 03/07/2023: Your echo shows very mild increase in valve leak, I will recheck Echo in 1 year before your visit. Can you call our office and confirm an echo has been set before you see me  Normal LV systolic function with visual EF 60-65%. Left ventricle cavity is normal in size. Normal left ventricular wall thickness. Normal global wall motion. Indeterminate diastolic filling pattern, normal LAP.  Left atrial cavity is mildly dilated at 39.16 ml/m^2. Right atrial cavity is mildly dilated. Native mitral valve with mild leaflet thickening. Mild bileaflet prolapse with moderate mitral regurgitation, directed posteriorly. Moderate tricuspid regurgitation. No evidence of pulmonary hypertension. RVSP measures 32 mmHg. Compared to 02/09/2021: Mild MR and TR are now moderate, biatrial enlargement was no mentioned on prior study.   I will recheck echo prior to her next OV in a year

## 2023-03-13 NOTE — Progress Notes (Signed)
Called and spoke with patient regarding her echocardiogram results.

## 2023-05-09 ENCOUNTER — Other Ambulatory Visit: Payer: Self-pay | Admitting: Cardiology

## 2023-05-09 DIAGNOSIS — I10 Essential (primary) hypertension: Secondary | ICD-10-CM

## 2023-09-12 ENCOUNTER — Other Ambulatory Visit: Payer: Self-pay | Admitting: Cardiology

## 2024-02-04 ENCOUNTER — Ambulatory Visit: Payer: PRIVATE HEALTH INSURANCE | Admitting: Cardiology

## 2024-03-06 ENCOUNTER — Other Ambulatory Visit: Payer: Self-pay | Admitting: *Deleted

## 2024-03-06 DIAGNOSIS — I341 Nonrheumatic mitral (valve) prolapse: Secondary | ICD-10-CM

## 2024-03-09 ENCOUNTER — Other Ambulatory Visit: Payer: PRIVATE HEALTH INSURANCE

## 2024-03-09 ENCOUNTER — Other Ambulatory Visit (HOSPITAL_COMMUNITY): Payer: Medicare Other

## 2024-03-17 ENCOUNTER — Ambulatory Visit: Payer: PRIVATE HEALTH INSURANCE | Admitting: Cardiology

## 2024-04-14 ENCOUNTER — Ambulatory Visit (HOSPITAL_COMMUNITY)
Admission: RE | Admit: 2024-04-14 | Discharge: 2024-04-14 | Disposition: A | Discharge status: Home / Self Care | Source: Ambulatory Visit | Attending: Internal Medicine

## 2024-04-14 DIAGNOSIS — I341 Nonrheumatic mitral (valve) prolapse: Secondary | ICD-10-CM | POA: Insufficient documentation

## 2024-04-14 LAB — ECHOCARDIOGRAM COMPLETE
Area-P 1/2: 3.92 cm2
MV M vel: 6.44 m/s
MV Peak grad: 165.9 mmHg
S' Lateral: 2.3 cm

## 2024-04-18 ENCOUNTER — Ambulatory Visit: Payer: Self-pay | Admitting: Cardiology

## 2024-04-18 NOTE — Progress Notes (Signed)
 Essentially normal echocardiogram for age with normal LV systolic function and mild mitral valve prolapse with mild mitral regurgitation, no further evaluation is indicated.  No endocarditis prophylaxis is indicated.  MR= Mitral regurgitation. (leakage on the left heart valve)  Similarly TR means tricuspid regurgitation (leakage on the right heart valve). Mild MR or TR are probably normal variants unless physicians feel it is abnormal. Reassure.

## 2024-07-09 ENCOUNTER — Other Ambulatory Visit: Payer: Self-pay | Admitting: Cardiology

## 2024-07-09 DIAGNOSIS — I10 Essential (primary) hypertension: Secondary | ICD-10-CM

## 2024-08-23 ENCOUNTER — Other Ambulatory Visit: Payer: Self-pay | Admitting: Cardiology

## 2024-08-23 DIAGNOSIS — I10 Essential (primary) hypertension: Secondary | ICD-10-CM

## 2024-08-25 NOTE — Telephone Encounter (Signed)
 Please contact patient's son(per patient to schedule appointment with Dr. Ladona. Patient's last  appointment was 02/04/2023 and was supposed to have a year follow up. Thank you.

## 2024-08-26 ENCOUNTER — Other Ambulatory Visit: Payer: Self-pay | Admitting: Cardiology

## 2024-08-26 DIAGNOSIS — I10 Essential (primary) hypertension: Secondary | ICD-10-CM

## 2024-08-26 NOTE — Telephone Encounter (Signed)
" °*  STAT* If patient is at the pharmacy, call can be transferred to refill team.   1. Which medications need to be refilled? (please list name of each medication and dose if known) amLODipine  (NORVASC ) 5 MG tablet    2. Would you like to learn more about the convenience, safety, & potential cost savings by using the [facility]?    3. Are you open to using the [facility] (Type [facility]. *  4. Which pharmacy/location (including street and city if local pharmacy) is medication to be sent to? [id number] - [place], [place] - [address]     5. Do they need a 30 day or 90 day supply? 30  "

## 2024-08-28 ENCOUNTER — Telehealth: Payer: Self-pay | Admitting: Cardiology

## 2024-08-28 NOTE — Telephone Encounter (Signed)
" °*  STAT* If patient is at the pharmacy, call can be transferred to refill team.   1. Which medications need to be refilled? (please list name of each medication and dose if known) amLODipine  (NORVASC ) 5 MG tablet    2. Would you like to learn more about the convenience, safety, & potential cost savings by using the [facility]? No   3. Are you open to using the [facility] ([facility]. ) No   4. Which pharmacy/location (including street and city if local pharmacy) is medication to be sent to?  [id number] - [place], [place] - [address]   5. Do they need a 30 day or 90 day supply? 30 day  Pt is out of medication and has appt on 10/30/24  "

## 2024-08-31 MED ORDER — AMLODIPINE BESYLATE 5 MG PO TABS: 5.0000 mg | ORAL_TABLET | Freq: Every day | ORAL | 0 refills | Status: AC

## 2024-09-11 NOTE — Telephone Encounter (Signed)
 Refill already sent 01/26.

## 2024-10-30 ENCOUNTER — Ambulatory Visit: Admitting: Cardiology
